# Patient Record
Sex: Male | Born: 1935 | Race: White | Hispanic: No | Smoking: Never smoker
Health system: Southern US, Community
[De-identification: ages and names within clinical notes are randomized; demographics above are authoritative.]

## PROBLEM LIST (undated history)

## (undated) DIAGNOSIS — I471 Supraventricular tachycardia, unspecified: Secondary | ICD-10-CM

## (undated) DIAGNOSIS — I480 Paroxysmal atrial fibrillation: Secondary | ICD-10-CM

## (undated) HISTORY — DX: Supraventricular tachycardia: I47.1

## (undated) HISTORY — PX: BACK SURGERY: SHX140

## (undated) HISTORY — PX: APPENDECTOMY: SHX54

## (undated) HISTORY — DX: Supraventricular tachycardia, unspecified: I47.10

## (undated) HISTORY — PX: ELBOW SURGERY: SHX618

## (undated) HISTORY — PX: HERNIA REPAIR: SHX51

## (undated) HISTORY — DX: Paroxysmal atrial fibrillation: I48.0

## (undated) HISTORY — PX: CHOLECYSTECTOMY: SHX55

---

## 2003-03-08 ENCOUNTER — Encounter: Payer: Self-pay | Admitting: Orthopedic Surgery

## 2003-03-08 ENCOUNTER — Ambulatory Visit (HOSPITAL_COMMUNITY): Admission: RE | Admit: 2003-03-08 | Discharge: 2003-03-08 | Payer: Self-pay | Admitting: Orthopedic Surgery

## 2003-05-16 ENCOUNTER — Encounter: Payer: Self-pay | Admitting: Orthopedic Surgery

## 2003-05-17 ENCOUNTER — Inpatient Hospital Stay (HOSPITAL_COMMUNITY): Admission: RE | Admit: 2003-05-17 | Discharge: 2003-05-20 | Payer: Self-pay | Admitting: Orthopedic Surgery

## 2019-10-16 ENCOUNTER — Other Ambulatory Visit: Payer: Self-pay

## 2019-10-16 ENCOUNTER — Emergency Department: Payer: Medicare PPO

## 2019-10-16 ENCOUNTER — Emergency Department
Admission: EM | Admit: 2019-10-16 | Discharge: 2019-10-16 | Disposition: A | Discharge status: Home / Self Care | Payer: Medicare PPO | Attending: Emergency Medicine | Admitting: Emergency Medicine

## 2019-10-16 DIAGNOSIS — R7989 Other specified abnormal findings of blood chemistry: Secondary | ICD-10-CM | POA: Insufficient documentation

## 2019-10-16 DIAGNOSIS — I4891 Unspecified atrial fibrillation: Secondary | ICD-10-CM | POA: Diagnosis not present

## 2019-10-16 DIAGNOSIS — R079 Chest pain, unspecified: Secondary | ICD-10-CM

## 2019-10-16 LAB — BASIC METABOLIC PANEL
Anion gap: 10 (ref 5–15)
BUN: 34 mg/dL — ABNORMAL HIGH (ref 8–23)
CO2: 22 mmol/L (ref 22–32)
Calcium: 8.7 mg/dL — ABNORMAL LOW (ref 8.9–10.3)
Chloride: 105 mmol/L (ref 98–111)
Creatinine, Ser: 1.69 mg/dL — ABNORMAL HIGH (ref 0.61–1.24)
GFR calc Af Amer: 41 mL/min — ABNORMAL LOW (ref >60)
GFR calc non Af Amer: 36 mL/min — ABNORMAL LOW (ref >60)
Glucose, Bld: 114 mg/dL — ABNORMAL HIGH (ref 70–99)
Potassium: 4.1 mmol/L (ref 3.5–5.1)
Sodium: 137 mmol/L (ref 135–145)

## 2019-10-16 LAB — TROPONIN I (HIGH SENSITIVITY)
Troponin I (High Sensitivity): 32 ng/L — ABNORMAL HIGH (ref <18)
Troponin I (High Sensitivity): 29 ng/L — ABNORMAL HIGH (ref <18)

## 2019-10-16 LAB — CBC
HCT: 43.6 % (ref 39.0–52.0)
Hemoglobin: 14.7 g/dL (ref 13.0–17.0)
MCH: 34.5 pg — ABNORMAL HIGH (ref 26.0–34.0)
MCHC: 33.7 g/dL (ref 30.0–36.0)
MCV: 102.3 fL — ABNORMAL HIGH (ref 80.0–100.0)
Platelets: 169 10*3/uL (ref 150–400)
RBC: 4.26 MIL/uL (ref 4.22–5.81)
RDW: 14.2 % (ref 11.5–15.5)
WBC: 7 10*3/uL (ref 4.0–10.5)
nRBC: 0 % (ref 0.0–0.2)

## 2019-10-16 MED ORDER — SODIUM CHLORIDE 0.9% FLUSH: 3.0000 mL | Freq: Once | INTRAVENOUS | Status: DC

## 2019-10-16 MED ORDER — APIXABAN 2.5 MG PO TABS: 2.5000 mg | ORAL_TABLET | Freq: Two times a day (BID) | ORAL | 1 refills | Status: DC

## 2019-10-16 MED ORDER — DILTIAZEM HCL ER COATED BEADS 120 MG PO CP24: 120.0000 mg | ORAL_CAPSULE | Freq: Every day | ORAL | 1 refills | Status: DC

## 2019-10-16 MED ORDER — DILTIAZEM HCL 60 MG PO TABS
30.0000 mg | ORAL_TABLET | Freq: Once | ORAL | Status: AC
  Administered 2019-10-16: 21:00:00 30 mg via ORAL
  Filled 2019-10-16: qty 1

## 2019-10-16 MED ORDER — DILTIAZEM HCL 25 MG/5ML IV SOLN
10.0000 mg | Freq: Once | INTRAVENOUS | Status: AC
  Administered 2019-10-16: 10 mg via INTRAVENOUS
  Filled 2019-10-16: qty 5

## 2019-10-16 NOTE — ED Triage Notes (Signed)
Pt c/o substernal chest pain for the past 2-3 days with intermittent chest heaviness/SOB. Denies N/V/diaphoresis, pt also c/o BL LE pain . No noted pedal edema., pulses present

## 2019-10-16 NOTE — Discharge Instructions (Addendum)
As we discussed your heart is in an abnormal rhythm called atrial fibrillation.  We have prescribed you medication to help keep the heart rate from beating too fast.  Additionally with you on a blood thinner.  Because of this please stop taking aspirin.  Additionally as we discussed we found that your kidney function was abnormal.  While you did decline admission to work on these problems, we do recommend that you focus on hydrating and establish care with a primary care doctor to recheck your kidney function.  We have also given you the number and name of a cardiologist to help manage your atrial fibrillation. Please return if you were to have any further chest pain or dizziness. Additionally with the blood thinner if you were to notice any bleeding or have any falls or trauma you would need to be seen.

## 2019-10-16 NOTE — ED Provider Notes (Signed)
[facility] Emergency Department Provider Note   ____________________________________________   I have reviewed the triage vital signs and the nursing notes.   HISTORY  Chief Complaint Chest Pain   History limited by: Not Limited   HPI Gary Lane is a 86 y.o. male who presents to the emergency department today with concerns for chest pain.  He states that he has had a sense of some heaviness in the center part of his chest.  Has been present for the past 2 or 3 days.  Is accompanied with some shortness of breath and dizziness.  He states he did have a fall because of it.  Patient says that he has had issues since receiving a flu vaccine over 1 year ago.  Since that time he has had intermittent episodes of dizziness and weakness in his legs.  The time my exam he denies any chest pain. Denies any fevers.   Records reviewed. Per medical record review patient has a history of cholecystectomy.  History reviewed. No pertinent past medical history.  There are no problems to display for this patient.   Past Surgical History:  Procedure Laterality Date  . ABDOMINAL SURGERY    . CHOLECYSTECTOMY      Prior to Admission medications   Not on File    Allergies Patient has no known allergies.  No family history on file.  Social History Social History   Tobacco Use  . Smoking status: Never Smoker  . Smokeless tobacco: Never Used  Substance Use Topics  . Alcohol use: Not Currently  . Drug use: Not Currently    Review of Systems Constitutional: No fever/chills Eyes: No visual changes. ENT: No sore throat. Cardiovascular: Positive for chest pain. Respiratory: Positive for shortness of breath. Gastrointestinal: No abdominal pain.  No nausea, no vomiting.  No diarrhea.   Genitourinary: Negative for dysuria. Musculoskeletal: Negative for back pain. Skin: Negative for rash. Neurological: Negative for headaches, focal weakness or  numbness.  ____________________________________________   PHYSICAL EXAM:  VITAL SIGNS: ED Triage Vitals  Enc Vitals Group     BP 10/16/19 1631 123/80     Pulse Rate 10/16/19 1631 70     Resp 10/16/19 1631 18     Temp 10/16/19 1631 97.6 F (36.4 C)     Temp Source 10/16/19 1631 Oral     SpO2 10/16/19 1631 98 %     Weight 10/16/19 1632 185 lb (83.9 kg)     Height 10/16/19 1632 6' (1.829 m)     Head Circumference --      Peak Flow --      Pain Score 10/16/19 1634 0   Constitutional: Alert and oriented.  Eyes: Conjunctivae are normal.  ENT      Head: Normocephalic and atraumatic.      Nose: No congestion/rhinnorhea.      Mouth/Throat: Mucous membranes are moist.      Neck: No stridor. Hematological/Lymphatic/Immunilogical: No cervical lymphadenopathy. Cardiovascular: Tachycardic, irregularly irregular rhythm.  No murmurs, rubs, or gallops.  Respiratory: Normal respiratory effort without tachypnea nor retractions. Breath sounds are clear and equal bilaterally. No wheezes/rales/rhonchi. Gastrointestinal: Soft and non tender. No rebound. No guarding.  Genitourinary: Deferred Musculoskeletal: Normal range of motion in all extremities. No lower extremity edema. Neurologic:  Normal speech and language. No gross focal neurologic deficits are appreciated.  Skin:  Skin is warm, dry and intact. No rash noted. Psychiatric: Mood and affect are normal. Speech and behavior are normal. Patient exhibits appropriate insight  and judgment.  ____________________________________________    LABS (pertinent positives/negatives)  Trop 32 -> 29 CBC wbc 7.0, hgb 14.7, plt 169 BMP na 137, k 4.1, glu 114, cr 1.69  ____________________________________________   EKG  I, Nance Pear, attending physician, personally viewed and interpreted this EKG  EKG Time: 1635 Rate: 129 Rhythm: atrial fibrillation with RVR Axis: left axis deviation Intervals: qtc 366 QRS: narrow, q waves V1, III ST  changes: no st elevation Impression: abnormal ekg  ____________________________________________    RADIOLOGY  CXR No focal consolidation  ____________________________________________   PROCEDURES  Procedures  ____________________________________________   INITIAL IMPRESSION / ASSESSMENT AND PLAN / ED COURSE  Pertinent labs & imaging results that were available during my care of the patient were reviewed by me and considered in my medical decision making (see chart for details).   Patient presented to the emergency department today because of concerns for some chest discomfort, dizziness and shortness of breath.  Patient's EKG did show A. fib with RVR.  Patient denies any history of atrial fibrillation but states he has not been to a doctor in many years.  His rate did improve after diltiazem.  Blood work showed slight elevation of opponent.  This did improve on repeat and I think this is likely secondary to increased rate rather than true ACS.  Patient's creatinine was also elevated here.  Because of these new findings I did recommend admission to the patient.  I discussed my concern for both the abnormal heart rhythm as well as elevated creatinine.  Patient however declined admission.  He stated he very much wanted to go home.  He did understand my concerns.  I discussed with Dr. Rayann Heman with cardiology.  Will plan on sending home on diltiazem as well as Eliquis.  Will plan on close cardiology follow-up.  I also discussed with patient portance of establishing care with primary care to reevaluate creatinine and kidney function.   ____________________________________________   FINAL CLINICAL IMPRESSION(S) / ED DIAGNOSES  Final diagnoses:  Atrial fibrillation, unspecified type ([place])  Elevated creatinine   Note: This dictation was prepared with Dragon dictation. Any transcriptional errors that result from this process are unintentional     Nance Pear, MD 10/16/19 2117

## 2019-10-17 ENCOUNTER — Telehealth (HOSPITAL_COMMUNITY): Payer: Self-pay | Admitting: Nurse Practitioner

## 2019-10-17 NOTE — Telephone Encounter (Signed)
Per staff message from Ventura Sellers, RN, called pt to schedule Virtual visit in f/u from [facility] ED visit.  Pt has appt with [facility] 10/29/19 so this visit would be bridge until he gets established with [place].

## 2019-10-18 NOTE — Telephone Encounter (Signed)
Patient's son, Trey Paula, returned the call and is aware of Virtual appt 10/19/19 with Rudi Coco, NP.  Son provided his contact number for this appt.

## 2019-10-18 NOTE — Telephone Encounter (Signed)
-----   Message from Shona Simpson, RN sent at 10/17/2019 11:37 AM EST ----- Can call and offer virtual visit for later this week as bridge to him establishing with [place] office per allred. ----- Message ----- From: Hillis Range, MD Sent: 10/16/2019   8:23 PM EST To: Shona Simpson, RN  Seen in [facility] ER with new onset afib and discharged with new eliquis and diltiazem.  (he refused to stay).   Please call in am to check on him and schedule close follow-up if he is willing.  He doesn't have PCP or cardiology care.

## 2019-10-18 NOTE — Telephone Encounter (Signed)
Called and spoke with patient about scheduling Virtual appt.  Pt stated he would prefer the appt be scheduled with his son.  I advised pt I would contact his son if he provided me with the son's contact info.  Pt stated he will call his son and have him contact our office to schedule the appt.

## 2019-10-19 ENCOUNTER — Ambulatory Visit (HOSPITAL_COMMUNITY)
Admission: RE | Admit: 2019-10-19 | Discharge: 2019-10-19 | Disposition: A | Discharge status: Home / Self Care | Payer: Medicare PPO | Source: Ambulatory Visit | Attending: Nurse Practitioner | Admitting: Nurse Practitioner

## 2019-10-19 ENCOUNTER — Other Ambulatory Visit: Payer: Self-pay

## 2019-10-19 ENCOUNTER — Ambulatory Visit (INDEPENDENT_AMBULATORY_CARE_PROVIDER_SITE_OTHER): Payer: Medicare PPO | Admitting: Cardiovascular Disease

## 2019-10-19 ENCOUNTER — Encounter: Payer: Self-pay | Admitting: Cardiovascular Disease

## 2019-10-19 ENCOUNTER — Encounter (HOSPITAL_COMMUNITY): Payer: Self-pay

## 2019-10-19 VITALS — BP 100/56 | HR 102 | Ht 72.0 in | Wt 185.2 lb

## 2019-10-19 DIAGNOSIS — R55 Syncope and collapse: Secondary | ICD-10-CM | POA: Diagnosis not present

## 2019-10-19 DIAGNOSIS — I4891 Unspecified atrial fibrillation: Secondary | ICD-10-CM

## 2019-10-19 NOTE — Patient Instructions (Signed)
Medication Instructions:  Your physician recommends that you continue on your current medications as directed. Please refer to the Current Medication list given to you today.  *If you need a refill on your cardiac medications before your next appointment, please call your pharmacy*  Lab Work: None ordered If you have labs (blood work) drawn today and your tests are completely normal, you will receive your results only by: Marland Kitchen MyChart Message (if you have MyChart) OR . A paper copy in the mail If you have any lab test that is abnormal or we need to change your treatment, we will call you to review the results.  Testing/Procedures: Your physician has requested that you have an echocardiogram. Echocardiography is a painless test that uses sound waves to create images of your heart. It provides your doctor with information about the size and shape of your heart and how well your heart's chambers and valves are working. This procedure takes approximately one hour. There are no restrictions for this procedure.   Your physician has recommended that you wear an zio monitor AT. Zio monitors are medical devices that record the heart's electrical activity. Doctors most often [place] these monitors to diagnose arrhythmias. Arrhythmias are problems with the speed or rhythm of the heartbeat. The monitor is a small, portable device. You can wear one while you do your normal daily activities. This is usually used to diagnose what is causing palpitations/syncope (passing out).  The monitor will be mailed to your home. Please follow the instruction on applying the monitor. It is to be worn for 14 days and then mailed back to the monitor company as instructed.    Follow-Up: At [facility], you and your health needs are our priority.  As part of our continuing mission to provide you with exceptional heart care, we have created designated Provider Care Teams.  These Care Teams include your primary Cardiologist  (physician) and Advanced Practice Providers (APPs -  Physician Assistants and Nurse Practitioners) who all work together to provide you with the care you need, when you need it.  Your next appointment:   After testing    The format for your next appointment:   In Person  Provider:    You may see  Dr. Kirke Corin  or one of the following Advanced Practice Providers on your designated Care Team:    Nicolasa Ducking, NP  Eula Listen, PA-C  Marisue Ivan, PA-C   Other Instructions N/A

## 2019-10-19 NOTE — Progress Notes (Signed)
Reached out to son by phone re f/u ER. He reports that he is taking cardizem/ DOAC and HR is in the 60's today. He does have an in person visit with Dr. Kirke Corin today so I will not do a visit not or  charge for this visit.

## 2019-10-19 NOTE — Progress Notes (Signed)
Cardiology Office Note   Date:  10/19/2019   ID:  Gary Lane, DOB Feb 03, 1930, MRN [id number]  PCP:  Gary Ivan, MD  Cardiologist:   Gary Bears, MD   Chief Complaint  Patient presents with  . New Patient (Initial Visit)    ED F/U-A Fib; Meds verbally reviewed with patient.      History of Present Illness: Gary Lane is a 86 y.o. male who was referred by Gary Lane at [facility] ED for evaluation of atrial fibrillation. The patient reports being healthy throughout his life and has not seen a primary care physician in many years.  He does not take any medications at home although he does admit to taking Goody powder and aspirin daily for many years.  He said he was afraid to come off these even when he was not having pain and kept taking them.  He is not aware of any chronic health issues.  Recently, he fell at home twice and did not remember the details.  Possibly he had syncopal episodes lasting for few minutes.  He felt extremely weak and was dizzy.  He went to [facility] primary care and was sent from there to the emergency room.  He did complain of some atypical chest pain. His EKG showed atrial fibrillation with RVR with a ventricular rate of 129 bpm.  His labs showed a creatinine of 1.69 with a BUN of 34.  Baseline renal function is not known.  CBC was unremarkable.  High-sensitivity troponin was mildly elevated at 32 with no significant change in delta in 2 hours.  Hospital admission was recommended but the patient declined.  Ventricular rate improved with diltiazem and the patient was discharged on diltiazem CD 120 mg once daily as well as anticoagulation with Eliquis 2.5 mg twice daily. He started taking these medications and feels a little bit better.  He continues to feel weak but no further syncopal episodes.  He denies chest pain.  He is a lifelong non-smoker.  Family history is negative for coronary artery disease.  His mother had a stroke.  Past Medical History:   Diagnosis Date  . Arrhythmia     Past Surgical History:  Procedure Laterality Date  . ABDOMINAL SURGERY    . CHOLECYSTECTOMY    . ELBOW SURGERY Right      Current Outpatient Medications  Medication Sig Dispense Refill  . apixaban (ELIQUIS) 2.5 MG TABS tablet Take 1 tablet (2.5 mg total) by mouth 2 (two) times daily. 60 tablet 1  . diltiazem (CARDIZEM CD) 120 MG 24 hr capsule Take 1 capsule (120 mg total) by mouth daily. 30 capsule 1   No current facility-administered medications for this visit.    Allergies:   Patient has no known allergies.    Social History:  The patient  reports that he has never smoked. He has never used smokeless tobacco. He reports previous alcohol use. He reports previous drug use.   Family History:  The patient's family history is negative for premature coronary artery disease.  His mother had a stroke.   ROS:  Please see the history of present illness.   Otherwise, review of systems are positive for none.   All other systems are reviewed and negative.    PHYSICAL EXAM: VS:  BP (!) 100/56 (BP Location: Left Arm, Patient Position: Sitting, Cuff Size: Normal)   Pulse (!) 102   Ht 6' (1.829 m)   Wt 185 lb 4 oz (84 kg)  BMI 25.12 kg/m  , BMI Body mass index is 25.12 kg/m. GEN: Well nourished, well developed, in no acute distress  HEENT: normal  Neck: no JVD, carotid bruits, or masses Cardiac: Irregularly irregular; no  rubs, or gallops,no edema .  1 out of 6 systolic murmur at the base. Respiratory:  clear to auscultation bilaterally, normal work of breathing GI: soft, nontender, nondistended, + BS MS: no deformity or atrophy  Skin: warm and dry, no rash Neuro:  Strength and sensation are intact Psych: euthymic mood, full affect   EKG:  EKG is ordered today. The ekg ordered today demonstrates atrial fibrillation with ventricular rate of 102 bpm.   Recent Labs: 10/16/2019: BUN 34; Creatinine, Ser 1.69; Hemoglobin 14.7; Platelets 169;  Potassium 4.1; Sodium 137    Lipid Panel No results found for: CHOL, TRIG, HDL, CHOLHDL, VLDL, LDLCALC, LDLDIRECT    Wt Readings from Last 3 Encounters:  10/19/19 185 lb 4 oz (84 kg)  10/16/19 185 lb (83.9 kg)       PAD Screen 10/19/2019  Previous PAD dx? No  Previous surgical procedure? No  Pain with walking? Yes  Subsides with rest? Yes  Feet/toe relief with dangling? No  Painful, non-healing ulcers? No  Extremities discolored? No      ASSESSMENT AND PLAN:  1.  Recent syncope: The exact etiology is not entirely clear.  Atrial fibrillation by itself usually does not cause syncope but it can in situations of significant rapid ventricular response with baseline hypotension or if there is postconversion pause.  I am going to obtain a 2-week outpatient monitor.  2.  Atrial fibrillation: Unknown duration but recently diagnosed.  Ventricular rate is better controlled with addition of small dose diltiazem.  Continue anticoagulation with Eliquis given chads vas score of 2.  I agree with the dose of 2.5 mg twice daily given creatinine above 1.5 and age above 80.  I requested an echocardiogram. Cardioversion can be considered upon follow-up.  3.  Abnormal kidney function: Possible chronic kidney disease given the we do not know his baseline.  He reports prolonged history of NSAIDs for many years.  I advised him to discontinue all NSAIDs products especially that he is now on anticoagulation with Eliquis.  He can use acetaminophen as needed.  The patient is going to see Dr. Netty Lane in early March.   Disposition:   FU with me in 1 month  Signed,  Gary Sacramento, MD  10/19/2019 3:38 PM    [place]

## 2019-10-29 ENCOUNTER — Ambulatory Visit: Payer: Medicare PPO | Admitting: Cardiology

## 2019-11-02 ENCOUNTER — Ambulatory Visit (INDEPENDENT_AMBULATORY_CARE_PROVIDER_SITE_OTHER): Payer: Medicare PPO

## 2019-11-02 DIAGNOSIS — I4891 Unspecified atrial fibrillation: Secondary | ICD-10-CM | POA: Diagnosis not present

## 2019-11-02 DIAGNOSIS — R55 Syncope and collapse: Secondary | ICD-10-CM | POA: Diagnosis not present

## 2019-11-07 ENCOUNTER — Ambulatory Visit (INDEPENDENT_AMBULATORY_CARE_PROVIDER_SITE_OTHER): Payer: Medicare PPO

## 2019-11-07 ENCOUNTER — Other Ambulatory Visit: Payer: Self-pay

## 2019-11-07 DIAGNOSIS — I4891 Unspecified atrial fibrillation: Secondary | ICD-10-CM | POA: Diagnosis not present

## 2019-11-07 DIAGNOSIS — R55 Syncope and collapse: Secondary | ICD-10-CM | POA: Diagnosis not present

## 2019-11-27 ENCOUNTER — Encounter: Payer: Self-pay | Admitting: Cardiovascular Disease

## 2019-11-27 ENCOUNTER — Ambulatory Visit (INDEPENDENT_AMBULATORY_CARE_PROVIDER_SITE_OTHER): Payer: Medicare PPO | Admitting: Cardiovascular Disease

## 2019-11-27 ENCOUNTER — Other Ambulatory Visit: Payer: Self-pay

## 2019-11-27 VITALS — BP 110/62 | HR 67 | Ht 72.0 in | Wt 185.2 lb

## 2019-11-27 DIAGNOSIS — R55 Syncope and collapse: Secondary | ICD-10-CM | POA: Diagnosis not present

## 2019-11-27 DIAGNOSIS — I480 Paroxysmal atrial fibrillation: Secondary | ICD-10-CM

## 2019-11-27 DIAGNOSIS — I4891 Unspecified atrial fibrillation: Secondary | ICD-10-CM | POA: Diagnosis not present

## 2019-11-27 MED ORDER — DILTIAZEM HCL ER COATED BEADS 120 MG PO CP24: 120.0000 mg | ORAL_CAPSULE | Freq: Every day | ORAL | 1 refills | Status: DC

## 2019-11-27 MED ORDER — APIXABAN 2.5 MG PO TABS: 2.5000 mg | ORAL_TABLET | Freq: Two times a day (BID) | ORAL | 1 refills | Status: DC

## 2019-11-27 NOTE — Patient Instructions (Signed)
Medication Instructions:  Your physician recommends that you continue on your current medications as directed. Please refer to the Current Medication list given to you today.  Your cardiac medications have been refilled.  *If you need a refill on your cardiac medications before your next appointment, please call your pharmacy*  Lab Work: Bmet and Cbc today.  If you have labs (blood work) drawn today and your tests are completely normal, you will receive your results only by: Marland Kitchen MyChart Message (if you have MyChart) OR . A paper copy in the mail If you have any lab test that is abnormal or we need to change your treatment, we will call you to review the results.  Testing/Procedures: None ordered  Follow-Up: At [facility], you and your health needs are our priority.  As part of our continuing mission to provide you with exceptional heart care, we have created designated Provider Care Teams.  These Care Teams include your primary Cardiologist (physician) and Advanced Practice Providers (APPs -  Physician Assistants and Nurse Practitioners) who all work together to provide you with the care you need, when you need it.  Your next appointment:   3 month(s)  The format for your next appointment:   In Person  Provider:    You may see  Dr. Kirke Corin or one of the following Advanced Practice Providers on your designated Care Team:    Nicolasa Ducking, NP  Eula Listen, PA-C  Marisue Ivan, PA-C   Other Instructions N/A

## 2019-11-27 NOTE — Progress Notes (Signed)
Cardiology Office Note   Date:  11/27/2019   ID:  Gary Lane, DOB [birth year], MRN [id number]  PCP:  Dion Body, MD  Cardiologist:   Kathlyn Sacramento, MD   Chief Complaint  Patient presents with  . office visit    F/U after echo and wearing ZIO. Patient reports difficulty sleeping; Meds verbally reviewed with patient and son.      History of Present Illness: Gary Lane is a 86 y.o. male who is here today for follow-up visit regarding atrial fibrillation.   He was seen in January after he fell twice with possible syncopal episodes.  He felt extremely dizzy.  He was found to have A. fib with RVR with a heart rate of 129 bpm.  His labs showed a creatinine of 1.69 with a BUN of 34.  Baseline renal function is not known.  CBC was unremarkable.  High-sensitivity troponin was mildly elevated at 32 with no significant change in delta in 2 hours.  Hospital admission was recommended but the patient declined.  Ventricular rate improved with diltiazem and the patient was discharged on diltiazem CD 120 mg once daily as well as anticoagulation with Eliquis 2.5 mg twice daily. He was diagnosed with UTI and was treated with antibiotics.  He has been taking his cardiac medications regularly and he reports improvement in symptoms. During last visit, I instructed him to discontinue all NSAIDs given that he was taking Goody powder on a regular basis for many years which might have affected his kidney function.  He reports almost resolution of dizziness with improvement in shortness of breath and no chest pain. Echocardiogram was done which showed normal LV systolic function with no significant valvular abnormalities and normal atrial size.  He had an outpatient monitor done that showed short runs of wide-complex tachycardia likely aberrancy with intermittent A. fib but he was mostly in sinus rhythm.   Past Medical History:  Diagnosis Date  . Arrhythmia     Past Surgical History:   Procedure Laterality Date  . APPENDECTOMY    . BACK SURGERY     lumbar  . CHOLECYSTECTOMY    . ELBOW SURGERY Right   . HERNIA REPAIR       Current Outpatient Medications  Medication Sig Dispense Refill  . acetaminophen (TYLENOL) 500 MG tablet Take 500 mg by mouth 2 (two) times daily as needed.    Marland Kitchen apixaban (ELIQUIS) 2.5 MG TABS tablet Take 1 tablet (2.5 mg total) by mouth 2 (two) times daily. 60 tablet 1  . cyanocobalamin 1000 MCG tablet Take 1,000 mcg by mouth daily.    Marland Kitchen diltiazem (CARDIZEM CD) 120 MG 24 hr capsule Take 1 capsule (120 mg total) by mouth daily. 30 capsule 1  . milk thistle 175 MG tablet Take 175 mg by mouth daily.    Marland Kitchen Specialty Vitamins Products (MAGNESIUM, AMINO ACID CHELATE,) 133 MG tablet Take 1 tablet by mouth 2 (two) times daily. 200mg  BID     No current facility-administered medications for this visit.    Allergies:   Patient has no known allergies.    Social History:  The patient  reports that he has never smoked. He has never used smokeless tobacco. He reports previous alcohol use. He reports previous drug use.   Family History:  The patient's family history is negative for premature coronary artery disease.  His mother had a stroke.   ROS:  Please see the history of present illness.   Otherwise, review of  systems are positive for none.   All other systems are reviewed and negative.    PHYSICAL EXAM: VS:  BP 110/62 (BP Location: Right Arm, Patient Position: Sitting, Cuff Size: Normal)   Pulse 67   Ht 6' (1.829 m)   Wt 185 lb 4 oz (84 kg)   SpO2 94%   BMI 25.12 kg/m  , BMI Body mass index is 25.12 kg/m. GEN: Well nourished, well developed, in no acute distress  HEENT: normal  Neck: no JVD, carotid bruits, or masses Cardiac: Regular rate and rhythm; no  rubs, or gallops,no edema .  1 out of 6 systolic murmur at the base. Respiratory:  clear to auscultation bilaterally, normal work of breathing GI: soft, nontender, nondistended, + BS MS: no  deformity or atrophy  Skin: warm and dry, no rash Neuro:  Strength and sensation are intact Psych: euthymic mood, full affect   EKG:  EKG is ordered today. The ekg ordered today demonstrates sinus rhythm with first-degree AV block.  Poor R wave progression in the anterior leads.   Recent Labs: 10/16/2019: BUN 34; Creatinine, Ser 1.69; Hemoglobin 14.7; Platelets 169; Potassium 4.1; Sodium 137    Lipid Panel No results found for: CHOL, TRIG, HDL, CHOLHDL, VLDL, LDLCALC, LDLDIRECT    Wt Readings from Last 3 Encounters:  11/27/19 185 lb 4 oz (84 kg)  10/19/19 185 lb 4 oz (84 kg)  10/16/19 185 lb (83.9 kg)       PAD Screen 10/19/2019  Previous PAD dx? No  Previous surgical procedure? No  Pain with walking? Yes  Subsides with rest? Yes  Feet/toe relief with dangling? No  Painful, non-healing ulcers? No  Extremities discolored? No      ASSESSMENT AND PLAN:  1.  Recent syncope: I suspect was likely due to A. fib with RVR with some volume depletion.  No further episodes.  Echocardiogram showed normal EF.  2.  Paroxysmal atrial fibrillation: It appears that he converted back to normal sinus rhythm and his heart rate today 67 bpm.  Even on recent outpatient monitor, he was noted to be mostly in sinus rhythm.  CHA2DS2-VASc score is 2 and thus I recommend continuing anticoagulation with Eliquis.  I also recommend diltiazem.  I requested a follow-up CBC and basic metabolic profile to see where his kidney function is.  If creatinine is below 1.5, we should increase Eliquis to 5 mg twice daily.  3.  Abnormal kidney function: Possible chronic kidney disease given the we do not know his baseline.  History of excessive NSAIDs which were discontinued.  Repeat labs.    Disposition:   FU with me in 3 months  Signed,  Lorine Bears, MD  11/27/2019 3:29 PM    [facility]

## 2019-11-28 LAB — BASIC METABOLIC PANEL
BUN/Creatinine Ratio: 16 (ref 10–24)
BUN: 18 mg/dL (ref 8–27)
CO2: 21 mmol/L (ref 20–29)
Calcium: 8.8 mg/dL (ref 8.6–10.2)
Chloride: 103 mmol/L (ref 96–106)
Creatinine, Ser: 1.13 mg/dL (ref 0.76–1.27)
GFR calc Af Amer: 67 mL/min/{1.73_m2} (ref >59)
GFR calc non Af Amer: 58 mL/min/{1.73_m2} — ABNORMAL LOW (ref >59)
Glucose: 95 mg/dL (ref 65–99)
Potassium: 4.3 mmol/L (ref 3.5–5.2)
Sodium: 137 mmol/L (ref 134–144)

## 2019-11-28 LAB — CBC WITH DIFFERENTIAL/PLATELET
Basophils Absolute: 0 10*3/uL (ref 0.0–0.2)
Basos: 0 %
EOS (ABSOLUTE): 0 10*3/uL (ref 0.0–0.4)
Eos: 1 %
Hematocrit: 36.8 % — ABNORMAL LOW (ref 37.5–51.0)
Hemoglobin: 12.9 g/dL — ABNORMAL LOW (ref 13.0–17.7)
Immature Grans (Abs): 0 10*3/uL (ref 0.0–0.1)
Immature Granulocytes: 1 %
Lymphocytes Absolute: 1.6 10*3/uL (ref 0.7–3.1)
Lymphs: 36 %
MCH: 35.1 pg — ABNORMAL HIGH (ref 26.6–33.0)
MCHC: 35.1 g/dL (ref 31.5–35.7)
MCV: 100 fL — ABNORMAL HIGH (ref 79–97)
Monocytes Absolute: 0.4 10*3/uL (ref 0.1–0.9)
Monocytes: 10 %
Neutrophils Absolute: 2.3 10*3/uL (ref 1.4–7.0)
Neutrophils: 52 %
Platelets: 162 10*3/uL (ref 150–450)
RBC: 3.68 x10E6/uL — ABNORMAL LOW (ref 4.14–5.80)
RDW: 14.1 % (ref 11.6–15.4)
WBC: 4.3 10*3/uL (ref 3.4–10.8)

## 2019-12-04 ENCOUNTER — Telehealth: Payer: Self-pay

## 2019-12-04 DIAGNOSIS — I480 Paroxysmal atrial fibrillation: Secondary | ICD-10-CM

## 2019-12-04 DIAGNOSIS — Z7901 Long term (current) use of anticoagulants: Secondary | ICD-10-CM

## 2019-12-04 MED ORDER — APIXABAN 5 MG PO TABS: 5.0000 mg | ORAL_TABLET | Freq: Two times a day (BID) | ORAL | 1 refills | Status: DC

## 2019-12-04 NOTE — Telephone Encounter (Signed)
DPR on file. Patients son Trey Paula made aware of lab results and Dr. Jari Sportsman recommendation. Rx sent to the patients pharmacy for Eliquis 5 mg bid. Order is in Epic for 1 month cbc [facility].

## 2019-12-04 NOTE — Telephone Encounter (Signed)
-----   Message from Iran Ouch, MD sent at 11/30/2019  2:06 PM EST ----- Inform patient that labs showed mild anemia with improvement in renal function.  Increase Eliquis to 5 mg twice daily.  Forward results to primary care physician and recheck CBC in 1 month.

## 2019-12-04 NOTE — Telephone Encounter (Signed)
-----   Message from Muhammad A Arida, MD sent at 11/30/2019  2:06 PM EST ----- Inform patient that labs showed mild anemia with improvement in renal function.  Increase Eliquis to 5 mg twice daily.  Forward results to primary care physician and recheck CBC in 1 month.  

## 2019-12-10 MED ORDER — DILTIAZEM HCL ER COATED BEADS 120 MG PO CP24: 120.0000 mg | ORAL_CAPSULE | Freq: Every day | ORAL | 1 refills | Status: DC

## 2019-12-19 NOTE — Telephone Encounter (Signed)
Replied to patient in other MyChart chat box.

## 2020-02-26 ENCOUNTER — Encounter: Payer: Self-pay | Admitting: Physician Assistant

## 2020-02-26 NOTE — Progress Notes (Signed)
Cardiology Office Note    Date:  02/29/2020   ID:  Gary Lane, DOB November 24, 1929, MRN [id number]  PCP:  Dion Body, MD  Cardiologist:  Kathlyn Sacramento, MD  Electrophysiologist:  None   Chief Complaint: Follow-up  History of Present Illness:   Gary Lane is a 86 y.o. male with history of PAF on Eliquis, paroxysmal SVT, AKI, and possible prior syncope who presents for follow-up of the above.  He was seen in the [facility] ED in 10/2019 after being sent there by his PCPs office for weakness, dizziness, and fall with possible syncopal episode.  His EKG showed A. fib with RVR with ventricular rates in the 120s bpm.  Labs showed some AKI with a BUN of 34 and serum creatinine of 1.69, with baseline being unknown.  CBC was unremarkable.  High-sensitivity troponin mildly elevated at 32 with no significant change in the delta.  Hospital admission was recommended though the patient declined.  Ventricular rates improved with diltiazem and he was discharged on Cardizem CD 120 mg once daily along with Eliquis 2.5 mg twice daily (age/SCr).  He was also treated for UTI.  He was seen by cardiology in follow-up on 10/19/2019 and reported feeling a little bit better though continued to feel little bit weak.  He denied any further syncopal episodes.  He was advised to discontinue all NSAIDs given he had been taking Goody powder on a regular basis for many years, as this was felt to possibly be affecting his renal function.  Zio patch from 10/2019 showed a predominant rhythm of normal sinus with an average heart rate of 64 bpm, 7 episodes of WCT with the longest lasting 7 beats (felt to be likely aberrancy), frequent runs of SVT with the longest lasting 23 seconds.  Some of these episodes were irregular and A. fib was not completely excluded.  Isolated PVCs with a burden of 1.7%.  Echo in 11/2019 showed an EF of 60 to 65%, mild LVH, grade 1 diastolic dysfunction, no regional wall motion abnormalities, normal RV  systolic function and ventricular cavity size, normal size left and right atria, mild tricuspid regurgitation, mild aortic valve sclerosis without stenosis.  He was last seen in the office in 11/2019 with his syncope felt to be likely secondary to A. fib with RVR with some degree of volume depletion.  At that visit he was in sinus rhythm.  With noted improvement in his renal function as outlined below his Eliquis was increased to 5 mg twice daily.    MyChart messages reviewed from 12/2019, following patient's Covid vaccine, with bilateral lower extremity weakness and paresthesias.  He comes in accompanied by his son and is doing reasonably well from a cardiac perspective.  No chest pain, dyspnea, palpitations, dizziness, presyncope, syncope.  No falls, hematochezia, melena, hematuria, hemoptysis, or hematemesis.  Tolerating Eliquis.  He does note continued fatigue though his son feels like this is slowly improving.  He is now living back at his own house and continues to perform ADLs independently.  His main concern at this time is insomnia.   Labs independently reviewed: 02/2020 - Hgb 12.5, PLT 150 11/2019 - BUN 18, serum creatinine 1.13, potassium 4.3, Hgb 12.9, PLT 162  Past Medical History:  Diagnosis Date  . PAF (paroxysmal atrial fibrillation) ([place])   . Paroxysmal SVT (supraventricular tachycardia) ([facility])     Past Surgical History:  Procedure Laterality Date  . APPENDECTOMY    . BACK SURGERY     lumbar  .  CHOLECYSTECTOMY    . ELBOW SURGERY Right   . HERNIA REPAIR      Current Medications: Current Meds  Medication Sig  . acetaminophen (TYLENOL) 500 MG tablet Take 500 mg by mouth 2 (two) times daily as needed.  Marland Kitchen apixaban (ELIQUIS) 5 MG TABS tablet Take 1 tablet (5 mg total) by mouth 2 (two) times daily.  . cyanocobalamin 1000 MCG tablet Take 1,000 mcg by mouth daily.  Marland Kitchen diltiazem (CARDIZEM CD) 120 MG 24 hr capsule Take 1 capsule (120 mg total) by mouth daily.  . milk thistle 175 MG  tablet Take 175 mg by mouth daily.  Marland Kitchen Specialty Vitamins Products (MAGNESIUM, AMINO ACID CHELATE,) 133 MG tablet Take 1 tablet by mouth 2 (two) times daily. 200mg  BID  . [DISCONTINUED] apixaban (ELIQUIS) 5 MG TABS tablet Take 1 tablet (5 mg total) by mouth 2 (two) times daily.    Allergies:   Patient has no known allergies.   Social History   Socioeconomic History  . Marital status: Divorced    Spouse name: Not on file  . Number of children: Not on file  . Years of education: Not on file  . Highest education level: Not on file  Occupational History  . Not on file  Tobacco Use  . Smoking status: Never Smoker  . Smokeless tobacco: Never Used  Substance and Sexual Activity  . Alcohol use: Not Currently  . Drug use: Not Currently  . Sexual activity: Not on file  Other Topics Concern  . Not on file  Social History Narrative  . Not on file   Social Determinants of Health   Financial Resource Strain:   . Difficulty of Paying Living Expenses:   Food Insecurity:   . Worried About in the Last Year:   . Programme researcher, broadcasting/film/video in the Last Year:   Transportation Needs:   . Barista (Medical):   Freight forwarder Lack of Transportation (Non-Medical):   Physical Activity:   . Days of Exercise per Week:   . Minutes of Exercise per Session:   Stress:   . Feeling of Stress :   Social Connections:   . Frequency of Communication with Friends and Family:   . Frequency of Social Gatherings with Friends and Family:   . Attends Religious Services:   . Active Member of Clubs or Organizations:   . Attends Marland Kitchen Meetings:   Banker Marital Status:      Family History:  The patient's family history includes Stroke in his mother.  ROS:   Review of Systems  Constitutional: Positive for malaise/fatigue. Negative for chills, diaphoresis, fever and weight loss.  HENT: Negative for congestion.   Eyes: Negative for discharge and redness.  Respiratory: Negative for cough,  hemoptysis, sputum production, shortness of breath and wheezing.   Cardiovascular: Negative for chest pain, palpitations, orthopnea, claudication, leg swelling and PND.  Gastrointestinal: Negative for abdominal pain, blood in stool, heartburn, melena, nausea and vomiting.  Genitourinary: Negative for hematuria.  Musculoskeletal: Negative for falls and myalgias.  Skin: Negative for rash.  Neurological: Positive for weakness. Negative for dizziness, tingling, tremors, sensory change, speech change, focal weakness and loss of consciousness.  Endo/Heme/Allergies: Does not bruise/bleed easily.  Psychiatric/Behavioral: Negative for substance abuse. The patient has insomnia. The patient is not nervous/anxious.   All other systems reviewed and are negative.    EKGs/Labs/Other Studies Reviewed:    Studies reviewed were summarized above. The additional studies were reviewed today:  2D echo 11/2019: 1. Left ventricular ejection fraction, by visual estimation, is 60 to  65%. The left ventricle has normal function. There is mildly increased  left ventricular hypertrophy.  2. Left ventricular diastolic parameters are consistent with Grade I  diastolic dysfunction (impaired relaxation).  3. The left ventricle has no regional wall motion abnormalities.  4. Global right ventricle has normal systolic function.The right  ventricular size is normal. Right vetricular wall thickness was not  assessed.  5. Left atrial size was normal.  6. Right atrial size was normal.  7. The mitral valve is normal in structure. No evidence of mitral valve  regurgitation.  8. The tricuspid valve is grossly normal.  9. The tricuspid valve is grossly normal. Tricuspid valve regurgitation  is mild.  10. The aortic valve was not well visualized. Aortic valve regurgitation  is trivial. Mild aortic valve sclerosis without stenosis.  11. The pulmonic valve was not well visualized. Pulmonic valve  regurgitation is  trivial. __________  Luci Bank patch 10/2019: Normal sinus rhythm with an average heart rate of 64 bpm. 7 episodes of wide-complex tachycardia noted.  The longest lasted 7 beats. Frequent runs of SVT the longest lasted 23 seconds.  Some of these episodes are irregular and thus A. fib cannot be completely excluded. Isolated PVCs with a burden of 1.7%.   EKG:  EKG is ordered today.  The EKG ordered today demonstrates NSR, 66 bpm, first-degree AV block, no acute ST-T changes  Recent Labs: 11/27/2019: BUN 18; Creatinine, Ser 1.13; Hemoglobin 12.9; Platelets 162; Potassium 4.3; Sodium 137  Recent Lipid Panel No results found for: CHOL, TRIG, HDL, CHOLHDL, VLDL, LDLCALC, LDLDIRECT  PHYSICAL EXAM:    VS:  BP 120/60 (BP Location: Left Arm, Patient Position: Sitting, Cuff Size: Normal)   Pulse 66   Ht 6' (1.829 m)   Wt 184 lb 2 oz (83.5 kg)   SpO2 98%   BMI 24.97 kg/m   BMI: Body mass index is 24.97 kg/m.  Physical Exam  Constitutional: He is oriented to person, place, and time. He appears well-developed and well-nourished.  HENT:  Head: Normocephalic and atraumatic.  Eyes: Right eye exhibits no discharge. Left eye exhibits no discharge.  Neck: No JVD present.  Cardiovascular: Normal rate, regular rhythm, S1 normal, S2 normal and normal heart sounds. Exam reveals no distant heart sounds, no friction rub, no midsystolic click and no opening snap.  No murmur heard. Pulses:      Posterior tibial pulses are 2+ on the right side and 2+ on the left side.  Pulmonary/Chest: Effort normal and breath sounds normal. No respiratory distress. He has no decreased breath sounds. He has no wheezes. He has no rales. He exhibits no tenderness.  Abdominal: Soft. He exhibits no distension. There is no abdominal tenderness.  Musculoskeletal:        General: No edema.     Cervical back: Normal range of motion.  Neurological: He is alert and oriented to person, place, and time.  Skin: Skin is warm and dry. No  cyanosis. Nails show no clubbing.  Psychiatric: He has a normal mood and affect. His speech is normal and behavior is normal. Judgment and thought content normal.    Wt Readings from Last 3 Encounters:  02/29/20 184 lb 2 oz (83.5 kg)  11/27/19 185 lb 4 oz (84 kg)  10/19/19 185 lb 4 oz (84 kg)     ASSESSMENT & PLAN:   1. PAF: Maintaining sinus rhythm with well-controlled heart rate.  Given reports of insomnia and with noted first-degree AV block on EKG, which is relatively stable when compared to last study, we have elected to discontinue Cardizem CD.  Deferred initiation of beta-blocker given first-degree AV block.  Given his CHA2DS2-VASc of at least 75 (age x2, vascular disease) he will be continued on Eliquis.  Recent CBC obtained earlier today showed stable hemoglobin.  Recent renal function showed improved SCr.  2. Fall versus syncope: No further issues.  This has been thought to be in the setting of A. fib with RVR and dehydration.  Recent outpatient cardiac monitoring showed no malignant arrhythmias, high-grade AV block, or prolonged pauses.  3. First-degree AV block: Asymptomatic.  Discontinue diltiazem as outlined above.  4. AKI: Resolved with discontinuation of Goody powder and gentle hydration.  Continue to avoid excessive NSAID use.  Disposition: F/u with Dr. Kirke Corin or an APP in 3 months.   Medication Adjustments/Labs and Tests Ordered: Current medicines are reviewed at length with the patient today.  Concerns regarding medicines are outlined above. Medication changes, Labs and Tests ordered today are summarized above and listed in the Patient Instructions accessible in Encounters.   Signed, Eula Listen, PA-C 02/29/2020 12:50 PM     [facility] [address] [place], [place] [address] [phone]

## 2020-02-29 ENCOUNTER — Encounter: Payer: Self-pay | Admitting: Physician Assistant

## 2020-02-29 ENCOUNTER — Ambulatory Visit (INDEPENDENT_AMBULATORY_CARE_PROVIDER_SITE_OTHER): Payer: Medicare PPO | Admitting: Physician Assistant

## 2020-02-29 ENCOUNTER — Other Ambulatory Visit: Payer: Self-pay

## 2020-02-29 VITALS — BP 120/60 | HR 66 | Ht 72.0 in | Wt 184.1 lb

## 2020-02-29 DIAGNOSIS — R55 Syncope and collapse: Secondary | ICD-10-CM | POA: Diagnosis not present

## 2020-02-29 DIAGNOSIS — I480 Paroxysmal atrial fibrillation: Secondary | ICD-10-CM

## 2020-02-29 DIAGNOSIS — I440 Atrioventricular block, first degree: Secondary | ICD-10-CM

## 2020-02-29 DIAGNOSIS — N179 Acute kidney failure, unspecified: Secondary | ICD-10-CM | POA: Diagnosis not present

## 2020-02-29 MED ORDER — APIXABAN 5 MG PO TABS: 5.0000 mg | ORAL_TABLET | Freq: Two times a day (BID) | ORAL | 3 refills | Status: DC

## 2020-02-29 NOTE — Patient Instructions (Signed)
Medication Instructions:  1- STOP Diltiazem *If you need a refill on your cardiac medications before your next appointment, please call your pharmacy*   Lab Work: None ordered  If you have labs (blood work) drawn today and your tests are completely normal, you will receive your results only by: Marland Kitchen MyChart Message (if you have MyChart) OR . A paper copy in the mail If you have any lab test that is abnormal or we need to change your treatment, we will call you to review the results.   Testing/Procedures: None ordered    Follow-Up: At [facility], you and your health needs are our priority.  As part of our continuing mission to provide you with exceptional heart care, we have created designated Provider Care Teams.  These Care Teams include your primary Cardiologist (physician) and Advanced Practice Providers (APPs -  Physician Assistants and Nurse Practitioners) who all work together to provide you with the care you need, when you need it.  We recommend signing up for the patient portal called "MyChart".  Sign up information is provided on this After Visit Summary.  MyChart is used to connect with patients for Virtual Visits (Telemedicine).  Patients are able to view lab/test results, encounter notes, upcoming appointments, etc.  Non-urgent messages can be sent to your provider as well.   To learn more about what you can do with MyChart, go to [email or web address].    Your next appointment:   3 month(s)  The format for your next appointment:   In Person  Provider:    You may see Lorine Bears, MD or Eula Listen, PA-C

## 2020-06-05 ENCOUNTER — Encounter: Payer: Self-pay | Admitting: Cardiovascular Disease

## 2020-06-05 ENCOUNTER — Ambulatory Visit (INDEPENDENT_AMBULATORY_CARE_PROVIDER_SITE_OTHER): Payer: Medicare PPO | Admitting: Cardiovascular Disease

## 2020-06-05 ENCOUNTER — Other Ambulatory Visit: Payer: Self-pay

## 2020-06-05 VITALS — BP 108/60 | HR 72 | Ht 72.0 in | Wt 177.0 lb

## 2020-06-05 DIAGNOSIS — I480 Paroxysmal atrial fibrillation: Secondary | ICD-10-CM

## 2020-06-05 DIAGNOSIS — R55 Syncope and collapse: Secondary | ICD-10-CM | POA: Diagnosis not present

## 2020-06-05 NOTE — Progress Notes (Signed)
Cardiology Office Note   Date:  06/05/2020   ID:  EGOR FULLILOVE, DOB 21-Jan-1930, MRN [id number]  PCP:  Marisue Ivan, MD  Cardiologist:   Lorine Bears, MD   Chief Complaint  Patient presents with   Other    3 month follow up. patient c.o SOB when walking. meds reviewed verbally with patient.       History of Present Illness: Gary Lane is a 86 y.o. male who is here today for follow-up visit regarding paroxysmal atrial fibrillation and syncope. He had syncopal episodes in January when he was found to be in A. fib with RVR with mildly elevated creatinine. Ventricular rate improved with diltiazem. He was diagnosed with UTI and was treated with antibiotics.   He was taking NSAIDs and Goody powder on a regular basis and these were discontinued.  Echocardiogram in February showed normal LV systolic function with no significant valvular abnormalities and normal atrial size.  He had an outpatient monitor done that showed short runs of wide-complex tachycardia likely aberrancy with intermittent A. fib but he was mostly in sinus rhythm. His renal function subsequently improved and Eliquis was increased to 5 mg twice daily.  His blood pressure is somewhat on the low side and thus is no longer on diltiazem.  He is feeling well with no chest pain, dyspnea or palpitations.   Past Medical History:  Diagnosis Date   PAF (paroxysmal atrial fibrillation) ([facility])    Paroxysmal SVT (supraventricular tachycardia) ([facility])     Past Surgical History:  Procedure Laterality Date   APPENDECTOMY     BACK SURGERY     lumbar   CHOLECYSTECTOMY     ELBOW SURGERY Right    HERNIA REPAIR       Current Outpatient Medications  Medication Sig Dispense Refill   acetaminophen (TYLENOL) 500 MG tablet Take 500 mg by mouth 2 (two) times daily as needed.     apixaban (ELIQUIS) 5 MG TABS tablet Take 5 mg by mouth 2 (two) times daily.     cyanocobalamin 1000 MCG tablet Take 1,000 mcg by  mouth daily.     milk thistle 175 MG tablet Take 175 mg by mouth daily.     Specialty Vitamins Products (MAGNESIUM, AMINO ACID CHELATE,) 133 MG tablet Take 1 tablet by mouth 2 (two) times daily. 200mg  BID     No current facility-administered medications for this visit.    Allergies:   Patient has no known allergies.    Social History:  The patient  reports that he has never smoked. He has never used smokeless tobacco. He reports previous alcohol use. He reports previous drug use.   Family History:  The patient's family history is negative for premature coronary artery disease.  His mother had a stroke.   ROS:  Please see the history of present illness.   Otherwise, review of systems are positive for none.   All other systems are reviewed and negative.    PHYSICAL EXAM: VS:  BP 108/60 (BP Location: Right Arm, Patient Position: Sitting, Cuff Size: Normal)    Pulse 72    Ht 6' (1.829 m)    Wt 177 lb (80.3 kg)    BMI 24.01 kg/m  , BMI Body mass index is 24.01 kg/m. GEN: Well nourished, well developed, in no acute distress  HEENT: normal  Neck: no JVD, carotid bruits, or masses Cardiac: Regular rate and rhythm; no  rubs, or gallops,no edema .  1 out of 6 systolic  murmur at the base. Respiratory:  clear to auscultation bilaterally, normal work of breathing GI: soft, nontender, nondistended, + BS MS: no deformity or atrophy  Skin: warm and dry, no rash Neuro:  Strength and sensation are intact Psych: euthymic mood, full affect   EKG:  EKG is ordered today. The ekg ordered today demonstrates normal sinus rhythm with a heart rate of 73 bpm.  No significant ST or T wave changes.   Recent Labs: 11/27/2019: BUN 18; Creatinine, Ser 1.13; Hemoglobin 12.9; Platelets 162; Potassium 4.3; Sodium 137    Lipid Panel No results found for: CHOL, TRIG, HDL, CHOLHDL, VLDL, LDLCALC, LDLDIRECT    Wt Readings from Last 3 Encounters:  06/05/20 177 lb (80.3 kg)  02/29/20 184 lb 2 oz (83.5 kg)   11/27/19 185 lb 4 oz (84 kg)       PAD Screen 10/19/2019  Previous PAD dx? No  Previous surgical procedure? No  Pain with walking? Yes  Subsides with rest? Yes  Feet/toe relief with dangling? No  Painful, non-healing ulcers? No  Extremities discolored? No      ASSESSMENT AND PLAN:  1.  Syncope: Thought to be due to A. fib with RVR in the setting of volume depletion.  No further episodes.  2.  Paroxysmal atrial fibrillation: He is maintaining in sinus rhythm without any medication.  Continue long-term anticoagulation with Eliquis given CHA2DS2-VASc score of 2.  3.  Abnormal kidney function: Improved after stopping NSAIDs.  Most recent creatinine was 1.2.   Disposition:   FU with me in 6 months  Signed,  Lorine Bears, MD  06/05/2020 4:10 PM    [facility]

## 2020-06-05 NOTE — Patient Instructions (Signed)
Medication Instructions:   Your physician recommends that you continue on your current medications as directed. Please refer to the Current Medication list given to you today.  *If you need a refill on your cardiac medications before your next appointment, please call your pharmacy*   Lab Work:  None Ordered   If you have labs (blood work) drawn today and your tests are completely normal, you will receive your results only by: Marland Kitchen MyChart Message (if you have MyChart) OR . A paper copy in the mail If you have any lab test that is abnormal or we need to change your treatment, we will call you to review the results.   Testing/Procedures:  None Ordered  Follow-Up: At [facility], you and your health needs are our priority.  As part of our continuing mission to provide you with exceptional heart care, we have created designated Provider Care Teams.  These Care Teams include your primary Cardiologist (physician) and Advanced Practice Providers (APPs -  Physician Assistants and Nurse Practitioners) who all work together to provide you with the care you need, when you need it.  We recommend signing up for the patient portal called "MyChart".  Sign up information is provided on this After Visit Summary.  MyChart is used to connect with patients for Virtual Visits (Telemedicine).  Patients are able to view lab/test results, encounter notes, upcoming appointments, etc.  Non-urgent messages can be sent to your provider as well.   To learn more about what you can do with MyChart, go to [email or web address].    Your next appointment:   6 month(s)  The format for your next appointment:   In Person  Provider:    You may see Lorine Bears, MD or one of the following Advanced Practice Providers on your designated Care Team:    Nicolasa Ducking, NP  Eula Listen, PA-C  Marisue Ivan, PA-C    Other Instructions

## 2021-01-08 NOTE — Progress Notes (Signed)
Cardiology Office Note    Date:  01/09/2021   ID:  Gary Lane, DOB 18-May-1930, MRN [id number]  PCP:  Marisue Ivan, MD  Cardiologist:  Lorine Bears, MD  Electrophysiologist:  None   Chief Complaint: Follow up   History of Present Illness:   Gary Lane is a 87 y.o. male with history of PAF on Eliquis, paroxysmal SVT, AKI, and possible prior syncope who presents for follow-up of A. fib.  He was seen in the [facility] ED in 10/2019 after being sent there by his PCPs office for weakness, dizziness, and fall with possible syncopal episode.  His EKG showed A. fib with RVR with ventricular rates in the 120s bpm.  Labs showed some AKI with a BUN of 34 and serum creatinine of 1.69, with baseline being unknown.  CBC was unremarkable.  High-sensitivity troponin mildly elevated at 32 with no significant change in the delta.  Hospital admission was recommended though the patient declined.  Ventricular rates improved with diltiazem and he was discharged on Cardizem CD 120 mg along with Eliquis 2.5 mg twice daily (age/SCr).  He was also treated for UTI.  He was seen by cardiology in follow-up on 10/19/2019 and reported feeling a little bit better though continued to feel little bit weak.  He denied any further syncopal episodes.  He was advised to discontinue all NSAIDs given he had been taking Goody powder on a regular basis for many years, as this was felt to possibly be affecting his renal function.  Zio patch from 10/2019 showed a predominant rhythm of normal sinus with an average heart rate of 64 bpm, 7 episodes of WCT with the longest lasting 7 beats (felt to be likely aberrancy), frequent runs of SVT with the longest lasting 23 seconds.  Some of these episodes were irregular and A. fib was not completely excluded.  Isolated PVCs with a burden of 1.7%.  Echo in 11/2019 showed an EF of 60 to 65%, mild LVH, grade 1 diastolic dysfunction, no regional wall motion abnormalities, normal RV systolic  function and ventricular cavity size, normal size left and right atria, mild tricuspid regurgitation, mild aortic valve sclerosis without stenosis.  He was seen in the office in 11/2019 with his syncope felt to be likely secondary to A. fib with RVR with some degree of volume depletion.  At that visit he was in sinus rhythm.  With noted improvement in his renal function as outlined below his Eliquis was increased to 5 mg twice daily.  Soft BP has led to the holding of diltiazem in the past.  He was last seen in the office in 06/2020, and was doing well from a cardiac perspective.   He comes in accompanied by his son today and is doing well from a cardiac perspective.  No chest pain, palpitations, dyspnea, dizziness, presyncope, syncope, falls, hematochezia, or melena.  He does note generalized fatigue and easy bruising.  Otherwise, he is without complaints.  His weight is up 10 pounds when compared to his visit in 06/2020.  No lower extremity swelling, abdominal distention, orthopnea, PND, or early satiety.   Labs independently reviewed: 09/2020 - HGB 11.9, PLT 135, potassium 4.6, BUN 18, SCr 1.1 02/2020 - albumin 3.9, AST/ALT normal, TC 168, TG 92, HDL 35, LDL 114  Past Medical History:  Diagnosis Date  . PAF (paroxysmal atrial fibrillation) (HCC)   . Paroxysmal SVT (supraventricular tachycardia) (HCC)     Past Surgical History:  Procedure Laterality Date  .  APPENDECTOMY    . BACK SURGERY     lumbar  . CHOLECYSTECTOMY    . ELBOW SURGERY Right   . HERNIA REPAIR      Current Medications: Current Meds  Medication Sig  . acetaminophen (TYLENOL) 500 MG tablet Take 500 mg by mouth 2 (two) times daily as needed.  Marland Kitchen. apixaban (ELIQUIS) 5 MG TABS tablet Take 5 mg by mouth 2 (two) times daily.  . cyanocobalamin 1000 MCG tablet Take 1,000 mcg by mouth daily.  Marland Kitchen. GARLIC PO Take by mouth daily at 8 pm.  . Glucosamine HCl (GLUCOSAMINE PO) Take by mouth daily at 2 am.  . milk thistle 175 MG tablet Take  175 mg by mouth daily.  Marland Kitchen. VITAMIN D PO Take by mouth daily at 8 pm.    Allergies:   Patient has no known allergies.   Social History   Socioeconomic History  . Marital status: Divorced    Spouse name: Not on file  . Number of children: Not on file  . Years of education: Not on file  . Highest education level: Not on file  Occupational History  . Not on file  Tobacco Use  . Smoking status: Never Smoker  . Smokeless tobacco: Never Used  Vaping Use  . Vaping Use: Never used  Substance and Sexual Activity  . Alcohol use: Not Currently  . Drug use: Not Currently  . Sexual activity: Not on file  Other Topics Concern  . Not on file  Social History Narrative  . Not on file   Social Determinants of Health   Financial Resource Strain: Not on file  Food Insecurity: Not on file  Transportation Needs: Not on file  Physical Activity: Not on file  Stress: Not on file  Social Connections: Not on file     Family History:  The patient's family history includes Stroke in his mother.  ROS:   Review of Systems  Constitutional: Positive for malaise/fatigue. Negative for chills, diaphoresis, fever and weight loss.  HENT: Negative for congestion.   Eyes: Negative for discharge and redness.  Respiratory: Negative for cough, sputum production, shortness of breath and wheezing.   Cardiovascular: Negative for chest pain, palpitations, orthopnea, claudication, leg swelling and PND.  Gastrointestinal: Negative for abdominal pain, blood in stool, heartburn, melena, nausea and vomiting.  Musculoskeletal: Negative for falls and myalgias.  Skin: Negative for rash.  Neurological: Positive for weakness. Negative for dizziness, tingling, tremors, sensory change, speech change, focal weakness and loss of consciousness.  Endo/Heme/Allergies: Bruises/bleeds easily.  Psychiatric/Behavioral: Negative for substance abuse. The patient is not nervous/anxious.   All other systems reviewed and are  negative.    EKGs/Labs/Other Studies Reviewed:    Studies reviewed were summarized above. The additional studies were reviewed today:  2D echo 11/2019: 1. Left ventricular ejection fraction, by visual estimation, is 60 to  65%. The left ventricle has normal function. There is mildly increased  left ventricular hypertrophy.  2. Left ventricular diastolic parameters are consistent with Grade I  diastolic dysfunction (impaired relaxation).  3. The left ventricle has no regional wall motion abnormalities.  4. Global right ventricle has normal systolic function.The right  ventricular size is normal. Right vetricular wall thickness was not  assessed.  5. Left atrial size was normal.  6. Right atrial size was normal.  7. The mitral valve is normal in structure. No evidence of mitral valve  regurgitation.  8. The tricuspid valve is grossly normal.  9. The tricuspid valve is  grossly normal. Tricuspid valve regurgitation  is mild.  10. The aortic valve was not well visualized. Aortic valve regurgitation  is trivial. Mild aortic valve sclerosis without stenosis.  11. The pulmonic valve was not well visualized. Pulmonic valve  regurgitation is trivial. __________  Luci Bank patch 10/2019: Normal sinus rhythm with an average heart rate of 64 bpm. 7 episodes of wide-complex tachycardia noted. The longest lasted 7 beats. Frequent runs of SVT the longest lasted 23 seconds. Some of these episodes are irregular and thus A. fib cannot be completely excluded. Isolated PVCs with a burden of 1.7%.   EKG:  EKG is ordered today.  The EKG ordered today demonstrates NSR, 70 bpm, left axis deviation, first-degree AV block, rare PVC, no acute ST-T change  Recent Labs: 01/09/2021: BUN 17; Creatinine, Ser 1.07; Hemoglobin 11.1; Platelets 128; Potassium 4.1; Sodium 139  Recent Lipid Panel No results found for: CHOL, TRIG, HDL, CHOLHDL, VLDL, LDLCALC, LDLDIRECT  PHYSICAL EXAM:    VS:  BP 140/80 (BP  Location: Left Arm, Patient Position: Sitting, Cuff Size: Normal)   Pulse 70   Ht 6' (1.829 m)   Wt 187 lb 8 oz (85 kg)   SpO2 98%   BMI 25.43 kg/m   BMI: Body mass index is 25.43 kg/m.  Physical Exam Vitals reviewed.  Constitutional:      Appearance: He is well-developed.  HENT:     Head: Normocephalic and atraumatic.  Eyes:     General:        Right eye: No discharge.        Left eye: No discharge.  Neck:     Vascular: No JVD.  Cardiovascular:     Rate and Rhythm: Normal rate and regular rhythm.     Pulses: No midsystolic click and no opening snap.          Posterior tibial pulses are 2+ on the right side and 2+ on the left side.     Heart sounds: S1 normal and S2 normal. Heart sounds not distant. Murmur heard.   Systolic murmur is present with a grade of 2/6 at the upper right sternal border. No friction rub.  Pulmonary:     Effort: Pulmonary effort is normal. No respiratory distress.     Breath sounds: Normal breath sounds. No decreased breath sounds, wheezing or rales.  Chest:     Chest wall: No tenderness.  Abdominal:     General: There is no distension.     Palpations: Abdomen is soft.     Tenderness: There is no abdominal tenderness.  Musculoskeletal:     Cervical back: Normal range of motion.  Skin:    General: Skin is warm and dry.     Nails: There is no clubbing.  Neurological:     Mental Status: He is alert and oriented to person, place, and time.  Psychiatric:        Speech: Speech normal.        Behavior: Behavior normal.        Thought Content: Thought content normal.        Judgment: Judgment normal.     Wt Readings from Last 3 Encounters:  01/09/21 187 lb 8 oz (85 kg)  06/05/20 177 lb (80.3 kg)  02/29/20 184 lb 2 oz (83.5 kg)     ASSESSMENT & PLAN:   1. PAF: Maintaining sinus rhythm without rate controlling medications.  Remains on Eliquis 5 mg twice daily given CHADS2VASc at least 3 (age x 2,  vascular disease) without symptoms concerning  for bleeding.  Based on available data at this time he does not meet reduced dosing criteria.  Check BMP and CBC.  2. Syncope: No further episodes.  Felt to be related to A. fib with RVR in the context of volume depletion.  3. First degree AV block: Asymptomatic.  Not currently on any AV nodal blocking medications.  4. AKI: Improved following discontinuation of NSAIDs.  Check BMP.  5. Murmur: Check echo.  Disposition: F/u with Dr. Kirke Corin or an APP in 6 months, sooner if needed.   Medication Adjustments/Labs and Tests Ordered: Current medicines are reviewed at length with the patient today.  Concerns regarding medicines are outlined above. Medication changes, Labs and Tests ordered today are summarized above and listed in the Patient Instructions accessible in Encounters.   Signed, Eula Listen, PA-C 01/09/2021 12:28 PM     [facility] [place] [address] [place], [place] [address] [phone]

## 2021-01-09 ENCOUNTER — Other Ambulatory Visit
Admission: RE | Admit: 2021-01-09 | Discharge: 2021-01-09 | Disposition: A | Discharge status: Home / Self Care | Payer: Medicare PPO | Source: Ambulatory Visit | Attending: Physician Assistant | Admitting: Physician Assistant

## 2021-01-09 ENCOUNTER — Ambulatory Visit: Payer: Medicare PPO | Admitting: Physician Assistant

## 2021-01-09 ENCOUNTER — Telehealth: Payer: Self-pay | Admitting: *Deleted

## 2021-01-09 ENCOUNTER — Encounter: Payer: Self-pay | Admitting: Physician Assistant

## 2021-01-09 ENCOUNTER — Other Ambulatory Visit: Payer: Self-pay

## 2021-01-09 VITALS — BP 140/80 | HR 70 | Ht 72.0 in | Wt 187.5 lb

## 2021-01-09 DIAGNOSIS — I480 Paroxysmal atrial fibrillation: Secondary | ICD-10-CM | POA: Insufficient documentation

## 2021-01-09 DIAGNOSIS — I440 Atrioventricular block, first degree: Secondary | ICD-10-CM

## 2021-01-09 DIAGNOSIS — Z7901 Long term (current) use of anticoagulants: Secondary | ICD-10-CM

## 2021-01-09 DIAGNOSIS — N179 Acute kidney failure, unspecified: Secondary | ICD-10-CM | POA: Diagnosis not present

## 2021-01-09 DIAGNOSIS — R55 Syncope and collapse: Secondary | ICD-10-CM | POA: Diagnosis not present

## 2021-01-09 DIAGNOSIS — R011 Cardiac murmur, unspecified: Secondary | ICD-10-CM

## 2021-01-09 LAB — BASIC METABOLIC PANEL
Anion gap: 7 (ref 5–15)
BUN: 17 mg/dL (ref 8–23)
CO2: 25 mmol/L (ref 22–32)
Calcium: 8.8 mg/dL — ABNORMAL LOW (ref 8.9–10.3)
Chloride: 107 mmol/L (ref 98–111)
Creatinine, Ser: 1.07 mg/dL (ref 0.61–1.24)
GFR, Estimated: >60 mL/min (ref >60)
Glucose, Bld: 98 mg/dL (ref 70–99)
Potassium: 4.1 mmol/L (ref 3.5–5.1)
Sodium: 139 mmol/L (ref 135–145)

## 2021-01-09 LAB — CBC
HCT: 34.4 % — ABNORMAL LOW (ref 39.0–52.0)
Hemoglobin: 11.1 g/dL — ABNORMAL LOW (ref 13.0–17.0)
MCH: 34.6 pg — ABNORMAL HIGH (ref 26.0–34.0)
MCHC: 32.3 g/dL (ref 30.0–36.0)
MCV: 107.2 fL — ABNORMAL HIGH (ref 80.0–100.0)
Platelets: 128 10*3/uL — ABNORMAL LOW (ref 150–400)
RBC: 3.21 MIL/uL — ABNORMAL LOW (ref 4.22–5.81)
RDW: 14.1 % (ref 11.5–15.5)
WBC: 4.8 10*3/uL (ref 4.0–10.5)
nRBC: 0 % (ref 0.0–0.2)

## 2021-01-09 NOTE — Patient Instructions (Signed)
Medication Instructions:  No changes  *If you need a refill on your cardiac medications before your next appointment, please call your pharmacy*   Lab Work: BMET & CBC today. Go to [facility] and check in at registration. They will then tell you where to go.   If you have labs (blood work) drawn today and your tests are completely normal, you will receive your results only by: Marland Kitchen MyChart Message (if you have MyChart) OR . A paper copy in the mail If you have any lab test that is abnormal or we need to change your treatment, we will call you to review the results.   Testing/Procedures: Your physician has requested that you have an echocardiogram. Echocardiography is a painless test that uses sound waves to create images of your heart. It provides your doctor with information about the size and shape of your heart and how well your heart's chambers and valves are working. This procedure takes approximately one hour. There are no restrictions for this procedure.     Follow-Up: At [facility], you and your health needs are our priority.  As part of our continuing mission to provide you with exceptional heart care, we have created designated Provider Care Teams.  These Care Teams include your primary Cardiologist (physician) and Advanced Practice Providers (APPs -  Physician Assistants and Nurse Practitioners) who all work together to provide you with the care you need, when you need it.   Your next appointment:   6 month(s)  The format for your next appointment:   In Person  Provider:   Lorine Bears, MD or Eula Listen, PA-C

## 2021-01-09 NOTE — Telephone Encounter (Signed)
-----   Message from Sondra Barges, PA-C sent at 01/09/2021  1:11 PM EDT ----- Potassium at goal Renal function normal Blood count and platelet count mildly low Over the past year and 3 months his blood count has dropped from 14.7 to a current value of 11.1  Recommendations: -For now he should continue Eliquis -He should schedule an appointment with his PCP to discuss ongoing anemia and now thrombocytopenia if he has not already done so -Please have him come to the medical mall for a follow-up CBC in 1 week to ensure stable hemoglobin and platelet count -His downtrending hemoglobin is likely contributing to his fatigue

## 2021-01-09 NOTE — Telephone Encounter (Signed)
Spoke with patients son because his father has hard time hearing. Reviewed results and recommendations and he reports patient has upcoming appointment with his PCP. He verbalized understanding of our conversation and had no further questions at this time.

## 2021-01-16 ENCOUNTER — Other Ambulatory Visit
Admission: RE | Admit: 2021-01-16 | Discharge: 2021-01-16 | Disposition: A | Discharge status: Home / Self Care | Payer: Medicare PPO | Attending: Physician Assistant | Admitting: Physician Assistant

## 2021-01-16 DIAGNOSIS — Z7901 Long term (current) use of anticoagulants: Secondary | ICD-10-CM | POA: Diagnosis present

## 2021-01-16 LAB — CBC
HCT: 36 % — ABNORMAL LOW (ref 39.0–52.0)
Hemoglobin: 11.8 g/dL — ABNORMAL LOW (ref 13.0–17.0)
MCH: 35 pg — ABNORMAL HIGH (ref 26.0–34.0)
MCHC: 32.8 g/dL (ref 30.0–36.0)
MCV: 106.8 fL — ABNORMAL HIGH (ref 80.0–100.0)
Platelets: 131 10*3/uL — ABNORMAL LOW (ref 150–400)
RBC: 3.37 MIL/uL — ABNORMAL LOW (ref 4.22–5.81)
RDW: 14.1 % (ref 11.5–15.5)
WBC: 4 10*3/uL (ref 4.0–10.5)
nRBC: 0 % (ref 0.0–0.2)

## 2021-02-06 ENCOUNTER — Ambulatory Visit (INDEPENDENT_AMBULATORY_CARE_PROVIDER_SITE_OTHER): Payer: Medicare PPO

## 2021-02-06 ENCOUNTER — Other Ambulatory Visit: Payer: Self-pay

## 2021-02-06 DIAGNOSIS — R011 Cardiac murmur, unspecified: Secondary | ICD-10-CM | POA: Diagnosis not present

## 2021-02-06 LAB — ECHOCARDIOGRAM COMPLETE
AR max vel: 2.1 cm2
AV Area VTI: 1.86 cm2
AV Area mean vel: 1.79 cm2
AV Mean grad: 7.5 mmHg
AV Peak grad: 12.5 mmHg
Ao pk vel: 1.77 m/s
Area-P 1/2: 2.64 cm2
S' Lateral: 3 cm
Single Plane A4C EF: 58.7 %

## 2021-02-06 MED ORDER — PERFLUTREN LIPID MICROSPHERE
1.0000 mL | INTRAVENOUS | Status: AC | PRN
  Administered 2021-02-06: 2 mL via INTRAVENOUS

## 2021-06-11 ENCOUNTER — Other Ambulatory Visit: Payer: Self-pay

## 2021-06-11 ENCOUNTER — Encounter: Payer: Self-pay | Admitting: Cardiovascular Disease

## 2021-06-11 ENCOUNTER — Ambulatory Visit: Payer: Medicare PPO | Admitting: Cardiovascular Disease

## 2021-06-11 VITALS — BP 158/60 | HR 65 | Ht 72.0 in | Wt 183.2 lb

## 2021-06-11 DIAGNOSIS — I480 Paroxysmal atrial fibrillation: Secondary | ICD-10-CM

## 2021-06-11 NOTE — Patient Instructions (Signed)

## 2021-06-11 NOTE — Progress Notes (Signed)
Cardiology Office Note   Date:  06/11/2021   ID:  Gary Lane, DOB [birth year], MRN [id number]  PCP:  Marisue Ivan, MD  Cardiologist:   Lorine Bears, MD   Chief Complaint  Patient presents with   Other    D/c Eliquis no complains today. Meds reviewed verbally with pt.       History of Present Illness: Gary Lane is a 87 y.o. male who is here today for follow-up visit regarding paroxysmal atrial fibrillation and syncope. He had syncopal episodes in January of 2021 when he was found to be in A. fib with RVR with mildly elevated creatinine. Ventricular rate improved with diltiazem. He was diagnosed with UTI and was treated with antibiotics.   He was taking NSAIDs and Goody powder on a regular basis and these were discontinued.  Echocardiogram in February 2021 showed normal LV systolic function with no significant valvular abnormalities and normal atrial size.  He had an outpatient monitor done that showed short runs of wide-complex tachycardia likely aberrancy with intermittent A. fib but he was mostly in sinus rhythm. Diltiazem was subsequently discontinued due to hypotension.  The patient was noted to have gradually decreasing hemoglobin most recently 11.8 and also his platelet count decreased to 131,000.  He decided to stop Eliquis on his own in May because it was making him feel bad.  He could not explain in more details.  He is very hard of hearing.     Past Medical History:  Diagnosis Date   PAF (paroxysmal atrial fibrillation) (HCC)    Paroxysmal SVT (supraventricular tachycardia) (HCC)     Past Surgical History:  Procedure Laterality Date   APPENDECTOMY     BACK SURGERY     lumbar   CHOLECYSTECTOMY     ELBOW SURGERY Right    HERNIA REPAIR       Current Outpatient Medications  Medication Sig Dispense Refill   acetaminophen (TYLENOL) 500 MG tablet Take 500 mg by mouth 2 (two) times daily as needed.     cyanocobalamin 1000 MCG tablet Take 1,000  mcg by mouth daily.     GARLIC PO Take by mouth daily at 8 pm.     Glucosamine HCl (GLUCOSAMINE PO) Take by mouth daily at 2 am.     milk thistle 175 MG tablet Take 175 mg by mouth daily.     VITAMIN D PO Take by mouth daily at 8 pm.     apixaban (ELIQUIS) 5 MG TABS tablet Take 5 mg by mouth 2 (two) times daily. (Patient not taking: Reported on 06/11/2021)     No current facility-administered medications for this visit.    Allergies:   Patient has no known allergies.    Social History:  The patient  reports that he has never smoked. He has never used smokeless tobacco. He reports that he does not currently use alcohol. He reports that he does not currently use drugs.   Family History:  The patient's family history is negative for premature coronary artery disease.  His mother had a stroke.   ROS:  Please see the history of present illness.   Otherwise, review of systems are positive for none.   All other systems are reviewed and negative.    PHYSICAL EXAM: VS:  BP (!) 158/60 (BP Location: Left Arm, Patient Position: Sitting, Cuff Size: Normal)   Pulse 65   Ht 6' (1.829 m)   Wt 183 lb 4 oz (83.1 kg)   SpO2  97%   BMI 24.85 kg/m  , BMI Body mass index is 24.85 kg/m. GEN: Well nourished, well developed, in no acute distress  HEENT: normal  Neck: no JVD, carotid bruits, or masses Cardiac: Regular rate and rhythm; no  rubs, or gallops,no edema .  1 out of 6 systolic murmur at the base. Respiratory:  clear to auscultation bilaterally, normal work of breathing GI: soft, nontender, nondistended, + BS MS: no deformity or atrophy  Skin: warm and dry, no rash Neuro:  Strength and sensation are intact Psych: euthymic mood, full affect   EKG:  EKG is ordered today. The ekg ordered today demonstrates sinus rhythm with sinus arrhythmia and first-degree AV block.  Possible old inferior infarct.   Recent Labs: 01/09/2021: BUN 17; Creatinine, Ser 1.07; Potassium 4.1; Sodium 139 01/16/2021:  Hemoglobin 11.8; Platelets 131    Lipid Panel No results found for: CHOL, TRIG, HDL, CHOLHDL, VLDL, LDLCALC, LDLDIRECT    Wt Readings from Last 3 Encounters:  06/11/21 183 lb 4 oz (83.1 kg)  01/09/21 187 lb 8 oz (85 kg)  06/05/20 177 lb (80.3 kg)       PAD Screen 10/19/2019  Previous PAD dx? No  Previous surgical procedure? No  Pain with walking? Yes  Subsides with rest? Yes  Feet/toe relief with dangling? No  Painful, non-healing ulcers? No  Extremities discolored? No      ASSESSMENT AND PLAN:  1.  Syncope: Thought to be due to A. fib with RVR in the setting of volume depletion.  No further episodes.  2.  Paroxysmal atrial fibrillation: He is maintaining in sinus rhythm without any medication.  His CHA2DS2-VASc score is 2.  The patient does not want to go back on anticoagulation.  His son was present and I had a discussion with both of them about his annual risk of stroke.  The patient seems to understand and still declined anticoagulation.  3.  Abnormal kidney function: Improved after stopping NSAIDs.  Most recent creatinine was 1.3.   Disposition:   FU with me in 6 months  Signed,  Lorine Bears, MD  06/11/2021 4:03 PM    [facility]

## 2021-08-07 ENCOUNTER — Ambulatory Visit: Payer: Medicare PPO | Admitting: Physician Assistant

## 2021-11-05 ENCOUNTER — Ambulatory Visit: Payer: Medicare PPO | Admitting: Dermatology

## 2021-11-05 ENCOUNTER — Other Ambulatory Visit: Payer: Self-pay

## 2021-11-05 DIAGNOSIS — L578 Other skin changes due to chronic exposure to nonionizing radiation: Secondary | ICD-10-CM | POA: Diagnosis not present

## 2021-11-05 DIAGNOSIS — B079 Viral wart, unspecified: Secondary | ICD-10-CM

## 2021-11-05 DIAGNOSIS — L821 Other seborrheic keratosis: Secondary | ICD-10-CM

## 2021-11-05 DIAGNOSIS — L820 Inflamed seborrheic keratosis: Secondary | ICD-10-CM | POA: Diagnosis not present

## 2021-11-05 NOTE — Patient Instructions (Addendum)
Seborrheic Keratosis  What causes seborrheic keratoses? Seborrheic keratoses are harmless, common skin growths that first appear during adult life.  As time goes by, more growths appear.  Some people may develop a large number of them.  Seborrheic keratoses appear on both covered and uncovered body parts.  They are not caused by sunlight.  The tendency to develop seborrheic keratoses can be inherited.  They vary in color from skin-colored to gray, brown, or even black.  They can be either smooth or have a rough, warty surface.   Seborrheic keratoses are superficial and look as if they were stuck on the skin.  Under the microscope this type of keratosis looks like layers upon layers of skin.  That is why at times the top layer may seem to fall off, but the rest of the growth remains and re-grows.    Treatment Seborrheic keratoses do not need to be treated, but can easily be removed in the office.  Seborrheic keratoses often cause symptoms when they rub on clothing or jewelry.  Lesions can be in the way of shaving.  If they become inflamed, they can cause itching, soreness, or burning.  Removal of a seborrheic keratosis can be accomplished by freezing, burning, or surgery. If any spot bleeds, scabs, or grows rapidly, please return to have it checked, as these can be an indication of a skin cancer.   Prior to procedure, discussed risks of blister formation, small wound, skin dyspigmentation, or rare scar following cryotherapy. Recommend Vaseline ointment to treated areas while healing.   If You Need Anything After Your Visit  If you have any questions or concerns for your doctor, please call our main line at [phone] and press option 4 to reach your doctor's medical assistant. If no one answers, please leave a voicemail as directed and we will return your call as soon as possible. Messages left after 4 pm will be answered the following business day.   You may also send [place] a message via MyChart. We  typically respond to MyChart messages within 1-2 business days.  For prescription refills, please ask your pharmacy to contact our office. Our fax number is [phone].  If you have an urgent issue when the clinic is closed that cannot wait until the next business day, you can page your doctor at the number below.    Please note that while we do our best to be available for urgent issues outside of office hours, we are not available 24/7.   If you have an urgent issue and are unable to reach [place], you may choose to seek medical care at your doctor's office, retail clinic, urgent care center, or emergency room.  If you have a medical emergency, please immediately call 911 or go to the emergency department.  Pager Numbers  - Dr. Gwen Pounds: [phone]  - Dr. Neale Burly: [phone]  - Dr. Roseanne Reno: [phone]  In the event of inclement weather, please call our main line at [phone] for an update on the status of any delays or closures.  Dermatology Medication Tips: Please keep the boxes that topical medications come in in order to help keep track of the instructions about where and how to use these. Pharmacies typically print the medication instructions only on the boxes and not directly on the medication tubes.   If your medication is too expensive, please contact our office at [phone] option 4 or send [place] a message through MyChart.   We are unable to tell what your co-pay for  medications will be in advance as this is different depending on your insurance coverage. However, we may be able to find a substitute medication at lower cost or fill out paperwork to get insurance to cover a needed medication.   If a prior authorization is required to get your medication covered by your insurance company, please allow [place] 1-2 business days to complete this process.  Drug prices often vary depending on where the prescription is filled and some pharmacies may offer cheaper prices.  The  website www.goodrx.com contains coupons for medications through different pharmacies. The prices here do not account for what the cost may be with help from insurance (it may be cheaper with your insurance), but the website can give you the price if you did not use any insurance.  - You can print the associated coupon and take it with your prescription to the pharmacy.  - You may also stop by our office during regular business hours and pick up a GoodRx coupon card.  - If you need your prescription sent electronically to a different pharmacy, notify our office through [facility] or by phone at [phone] option 4.     Si Usted Necesita Algo Despus de Su Visita  Tambin puede enviarnos un mensaje a travs de Clinical cytogeneticist. Por lo general respondemos a los mensajes de MyChart en el transcurso de 1 a 2 das hbiles.  Para renovar recetas, por favor pida a su farmacia que se ponga en contacto con nuestra oficina. Annie Sable de fax es [place] [phone].  Si tiene un asunto urgente cuando la clnica est cerrada y que no puede esperar hasta el siguiente da hbil, puede llamar/localizar a su doctor(a) al nmero que aparece a continuacin.   Por favor, tenga en cuenta que aunque hacemos todo lo posible para estar disponibles para asuntos urgentes fuera del horario de [place], no estamos disponibles las 24 horas del da, los 7 [address]  [address] de la [place].   Si tiene un problema urgente y no puede comunicarse con nosotros, puede optar por buscar atencin mdica  en el consultorio de su doctor(a), en una clnica privada, en un centro de atencin urgente o en una sala de emergencias.  Si tiene Engineer, drilling, por favor llame inmediatamente al 911 o vaya a la sala de emergencias.  Nmeros de bper  - Dr. Gwen Pounds: [phone]  - Dra. Moye: [phone]  - Dra. Roseanne Reno: [phone]  En caso de inclemencias del [place], por favor llame a Lacy Duverney principal al [phone] para una  actualizacin sobre el [place] de cualquier retraso o cierre.  Consejos para la medicacin en dermatologa: Por favor, guarde las cajas en las que vienen los medicamentos de uso tpico para ayudarle a seguir las instrucciones sobre dnde y cmo usarlos. Las farmacias generalmente imprimen las instrucciones del medicamento slo en las cajas y no directamente en los tubos del [place].   Si su medicamento es muy caro, por favor, pngase en contacto con Rolm Gala llamando al [phone] y presione la opcin 4 o envenos un mensaje a travs de Clinical cytogeneticist.   No podemos decirle cul ser su copago por los medicamentos por adelantado ya que esto es diferente dependiendo de la cobertura de su seguro. Sin embargo, es posible que podamos encontrar un medicamento sustituto a Audiological scientist un formulario para que el seguro cubra el medicamento que se considera necesario.   Si se requiere una autorizacin previa para que su compaa de seguros [place] su medicamento, por favor permtanos  de 1 a 2 das hbiles para completar este proceso.  Los precios de los medicamentos varan con frecuencia dependiendo del Environmental consultant de dnde se surte la receta y alguna farmacias pueden ofrecer precios ms baratos.  El sitio web www.goodrx.com tiene cupones para medicamentos de Airline pilot. Los precios aqu no tienen en cuenta lo que podra costar con la ayuda del seguro (puede ser ms barato con su seguro), pero el sitio web puede darle el precio si no utiliz Research scientist (physical sciences).  - Puede imprimir el cupn correspondiente y llevarlo con su receta a la farmacia.  - Tambin puede pasar por nuestra oficina durante el horario de atencin regular y Charity fundraiser una tarjeta de cupones de GoodRx.  - Si necesita que su receta se enve electrnicamente a una farmacia diferente, informe a nuestra oficina a travs de MyChart de [place] o por telfono llamando al [phone] y presione la opcin 4.

## 2021-11-05 NOTE — Progress Notes (Signed)
New Patient Visit  Subjective  Gary Lane is a 88 y.o. male who presents for the following: New Patient (Initial Visit) (Patient here today concering a few spots at face he would like checked. Patient states he is bothered by some of the spots and picks at them. Patient denies personal or family history of skin cancer.). The patient presents for Upper Body Skin Exam (UBSE) for skin cancer screening and mole check.  The patient has spots, moles and lesions to be evaluated, some may be new or changing and the patient has concerns that these could be cancer.  The following portions of the chart were reviewed this encounter and updated as appropriate:   Tobacco   Allergies   Meds   Problems   Med Hx   Surg Hx   Fam Hx      Review of Systems:  No other skin or systemic complaints except as noted in HPI or Assessment and Plan.  Objective  Well appearing patient in no apparent distress; mood and affect are within normal limits.  A focused examination was performed including face, chest, arms, hands. Relevant physical exam findings are noted in the Assessment and Plan.  Skin waist up examined.  left sideburn area x 1, left pectoral x 1, right bicep x 1, right infraorbital x 1 (4) Erythematous stuck-on, waxy papule or plaque             right index finger x 1 Verrucous papules -- Discussed viral etiology and contagion.    Assessment & Plan  Inflamed seborrheic keratosis (4) left sideburn area x 1, left pectoral x 1, right bicep x 1, right infraorbital x 1  Will recheck areas at next follow up and retreat if necessary.   Destruction of lesion - left sideburn area x 1, left pectoral x 1, right bicep x 1, right infraorbital x 1 Complexity: simple   Destruction method: cryotherapy   Informed consent: discussed and consent obtained   Timeout:  patient name, date of birth, surgical site, and procedure verified Lesion destroyed using liquid nitrogen: Yes   Region frozen until  ice ball extended beyond lesion: Yes   Outcome: patient tolerated procedure well with no complications   Post-procedure details: wound care instructions given   Additional details:  Prior to procedure, discussed risks of blister formation, small wound, skin dyspigmentation, or rare scar following cryotherapy. Recommend Vaseline ointment to treated areas while healing.   Viral warts, unspecified type right index finger x 1  Discussed viral etiology and risk of spread.  Discussed multiple treatments may be required to clear warts.  Discussed possible post-treatment dyspigmentation and risk of recurrence.  Destruction of lesion - right index finger x 1 Complexity: simple   Destruction method: cryotherapy   Informed consent: discussed and consent obtained   Timeout:  patient name, date of birth, surgical site, and procedure verified Lesion destroyed using liquid nitrogen: Yes   Region frozen until ice ball extended beyond lesion: Yes   Outcome: patient tolerated procedure well with no complications   Post-procedure details: wound care instructions given    Seborrheic Keratoses - Stuck-on, waxy, tan-brown papules and/or plaques  - Benign-appearing - Discussed benign etiology and prognosis. - Observe - Call for any changes  Actinic Damage - chronic, secondary to cumulative UV radiation exposure/sun exposure over time - diffuse scaly erythematous macules with underlying dyspigmentation - Recommend daily broad spectrum sunscreen SPF 30+ to sun-exposed areas, reapply every 2 hours as needed.  - Recommend  staying in the shade or wearing long sleeves, sun glasses (UVA+UVB protection) and wide brim hats (4-inch brim around the entire circumference of the hat). - Call for new or changing lesions.  Return for 6 week isk and wart follow up.  IAsher Muir, CMA, am acting as scribe for Armida Sans, MD. Documentation: I have reviewed the above documentation for accuracy and completeness,  and I agree with the above.  Armida Sans, MD

## 2021-11-08 ENCOUNTER — Encounter: Payer: Self-pay | Admitting: Dermatology

## 2021-12-17 ENCOUNTER — Ambulatory Visit: Payer: Medicare PPO | Admitting: Dermatology

## 2022-01-01 ENCOUNTER — Ambulatory Visit: Payer: Medicare PPO | Admitting: Cardiovascular Disease

## 2022-01-05 IMAGING — DX DG CHEST 1V PORT
1 series · 1 of 1 positions shown · non-contrast
Comparison: None.

CLINICAL DATA: 89-year-old male with chest pain.

EXAM:
PORTABLE CHEST 1 VIEW

[chest ap]
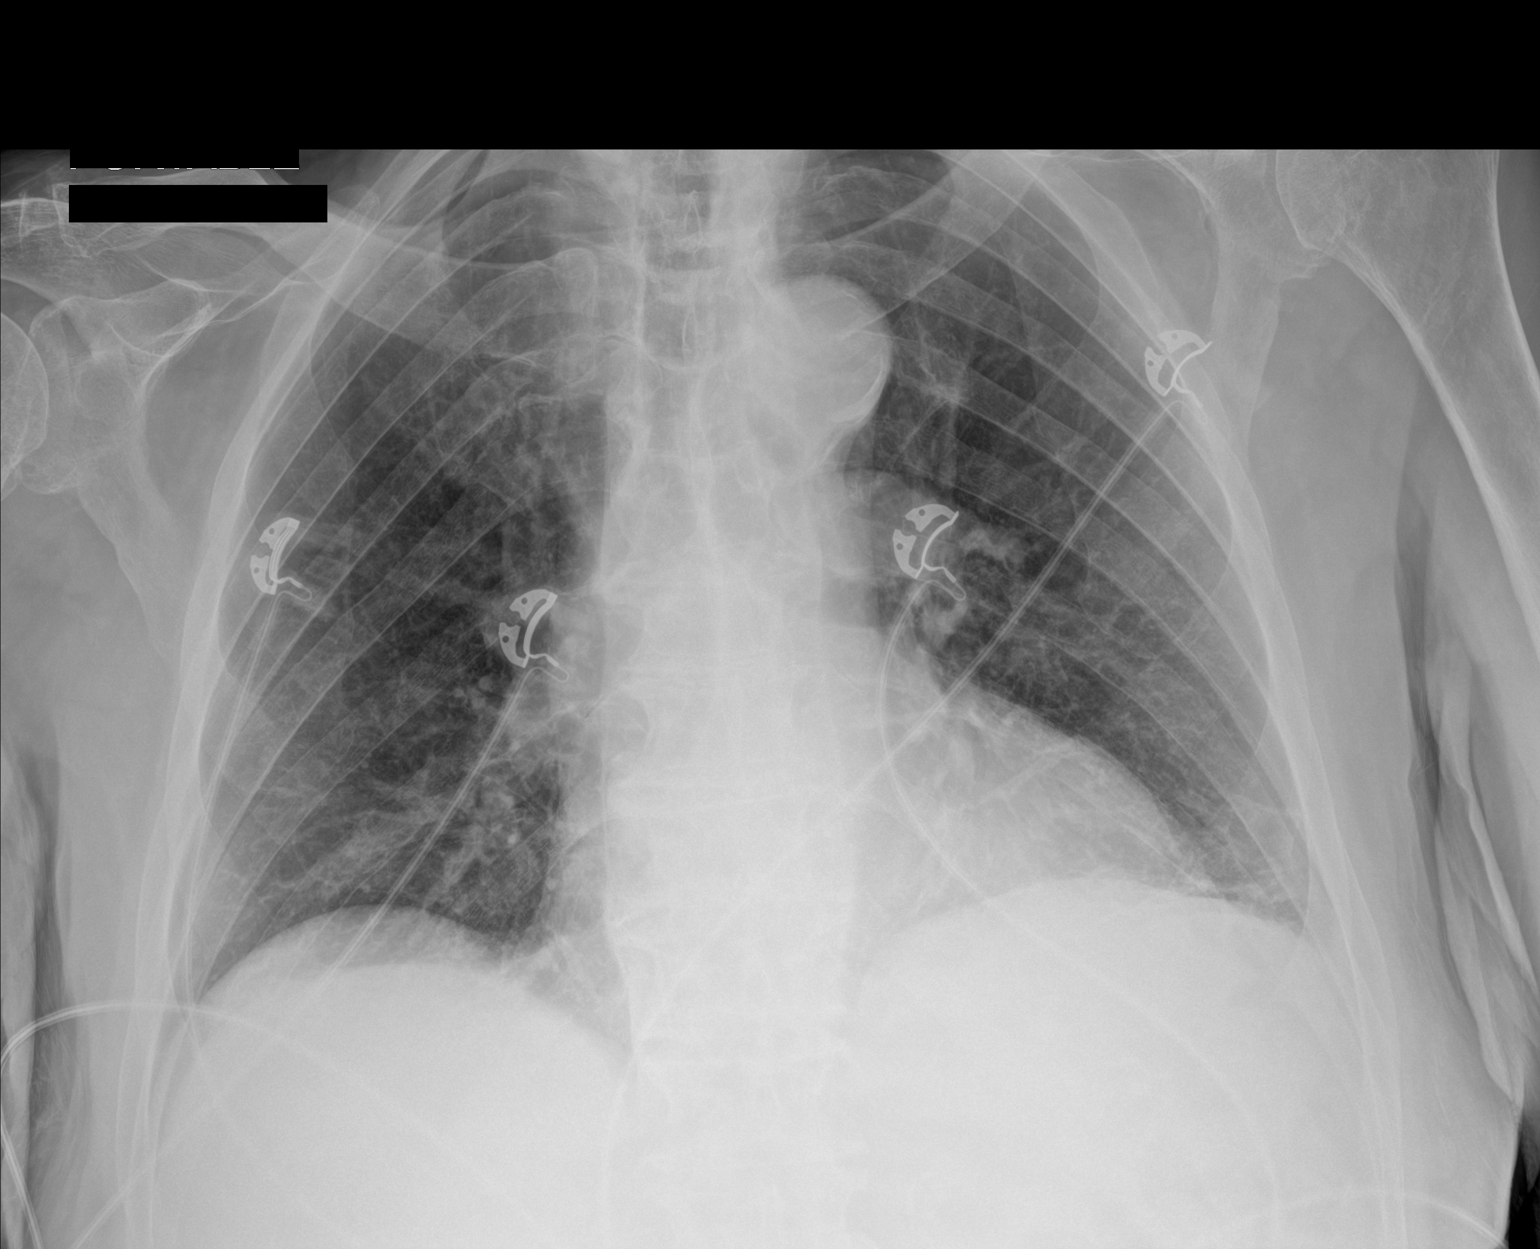

[1 of 1 positions shown; findings below may reference images not displayed]

FINDINGS: Minimal left lung base linear atelectasis/scarring. No focal
consolidation, pleural effusion, or pneumothorax. Top-normal cardiac
size. Atherosclerotic calcification of the aorta. No acute osseous
pathology.
IMPRESSION: No focal consolidation.

## 2022-01-21 ENCOUNTER — Ambulatory Visit: Payer: Medicare PPO | Admitting: Dermatology

## 2022-01-21 DIAGNOSIS — L821 Other seborrheic keratosis: Secondary | ICD-10-CM

## 2022-01-21 DIAGNOSIS — B079 Viral wart, unspecified: Secondary | ICD-10-CM

## 2022-01-21 DIAGNOSIS — L820 Inflamed seborrheic keratosis: Secondary | ICD-10-CM

## 2022-01-21 DIAGNOSIS — L578 Other skin changes due to chronic exposure to nonionizing radiation: Secondary | ICD-10-CM | POA: Diagnosis not present

## 2022-01-21 NOTE — Progress Notes (Signed)
? ?  Follow-Up Visit ?  ?Subjective  ?Gary Lane is a 88 y.o. male who presents for the following: Follow-up (Recheck ISK's on the L sideburn, L pectoral, R bicep, and R infraorbital as well as a wart on the R index finger - all lesions were treated with LN2. ). ?The patient has spots, moles and lesions to be evaluated, some may be new or changing and the patient has concerns that these could be cancer. ? ?The following portions of the chart were reviewed this encounter and updated as appropriate:  Tobacco  Allergies  Meds  Problems  Med Hx  Surg Hx  Fam Hx   ?  ? ?Objective  ?Well appearing patient in no apparent distress; mood and affect are within normal limits. ? ?A focused examination was performed including the face, trunk, and extremities. Relevant physical exam findings are noted in the Assessment and Plan. ? ?right index tip x 1 ?Verrucous papules -- Discussed viral etiology and contagion.  ? ?arms face and legs x 5 (5) ?Erythematous stuck-on, waxy papule or plaque ? ? ?Assessment & Plan  ?Viral warts, unspecified type ?right index tip x 1 ?Discussed viral etiology and risk of spread.  Discussed multiple treatments may be required to clear warts.  Discussed possible post-treatment dyspigmentation and risk of recurrence. ? ?Destruction of lesion - right index tip x 1 ?Complexity: simple   ?Destruction method: cryotherapy   ?Informed consent: discussed and consent obtained   ?Timeout:  patient name, date of birth, surgical site, and procedure verified ?Lesion destroyed using liquid nitrogen: Yes   ?Region frozen until ice ball extended beyond lesion: Yes   ?Outcome: patient tolerated procedure well with no complications   ?Post-procedure details: wound care instructions given   ?Additional details:  Prior to procedure, discussed risks of blister formation, small wound, skin dyspigmentation, or rare scar following cryotherapy. Recommend Vaseline ointment to treated areas while healing. ? ?Inflamed  seborrheic keratosis (5) ?arms face and legs x 5 ?Destruction of lesion - arms face and legs x 5 ?Complexity: simple   ?Destruction method: cryotherapy   ?Informed consent: discussed and consent obtained   ?Timeout:  patient name, date of birth, surgical site, and procedure verified ?Lesion destroyed using liquid nitrogen: Yes   ?Region frozen until ice ball extended beyond lesion: Yes   ?Outcome: patient tolerated procedure well with no complications   ?Post-procedure details: wound care instructions given   ?Additional details:  Prior to procedure, discussed risks of blister formation, small wound, skin dyspigmentation, or rare scar following cryotherapy. Recommend Vaseline ointment to treated areas while healing. ? ?Seborrheic Keratoses ?- Stuck-on, waxy, tan-brown papules and/or plaques  ?- Benign-appearing ?- Discussed benign etiology and prognosis. ?- Observe ?- Call for any changes ? ?Actinic Damage ?- chronic, secondary to cumulative UV radiation exposure/sun exposure over time ?- diffuse scaly erythematous macules with underlying dyspigmentation ?- Recommend daily broad spectrum sunscreen SPF 30+ to sun-exposed areas, reapply every 2 hours as needed.  ?- Recommend staying in the shade or wearing long sleeves, sun glasses (UVA+UVB protection) and wide brim hats (4-inch brim around the entire circumference of the hat). ?- Call for new or changing lesions. ? ?Return if symptoms worsen or fail to improve. ? ?I, Ruthell Rummage, CMA, am acting as scribe for Sarina Ser, MD. ?Documentation: I have reviewed the above documentation for accuracy and completeness, and I agree with the above. ? ?Sarina Ser, MD ? ? ?

## 2022-01-21 NOTE — Patient Instructions (Addendum)
Seborrheic Keratosis ? ?What causes seborrheic keratoses? ?Seborrheic keratoses are harmless, common skin growths that first appear during adult life.  As time goes by, more growths appear.  Some people may develop a large number of them.  Seborrheic keratoses appear on both covered and uncovered body parts.  They are not caused by sunlight.  The tendency to develop seborrheic keratoses can be inherited.  They vary in color from skin-colored to gray, brown, or even black.  They can be either smooth or have a rough, warty surface.   ?Seborrheic keratoses are superficial and look as if they were stuck on the skin.  Under the microscope this type of keratosis looks like layers upon layers of skin.  That is why at times the top layer may seem to fall off, but the rest of the growth remains and re-grows.   ? ?Treatment ?Seborrheic keratoses do not need to be treated, but can easily be removed in the office.  Seborrheic keratoses often cause symptoms when they rub on clothing or jewelry.  Lesions can be in the way of shaving.  If they become inflamed, they can cause itching, soreness, or burning.  Removal of a seborrheic keratosis can be accomplished by freezing, burning, or surgery. ?If any spot bleeds, scabs, or grows rapidly, please return to have it checked, as these can be an indication of a skin cancer. ? ? ?Cryotherapy Aftercare ? ?Wash gently with soap and water everyday.   ?Apply Vaseline and Band-Aid daily until healed.  ? ? ?If You Need Anything After Your Visit ? ?If you have any questions or concerns for your doctor, please call our main line at [phone] and press option 4 to reach your doctor's medical assistant. If no one answers, please leave a voicemail as directed and we will return your call as soon as possible. Messages left after 4 pm will be answered the following business day.  ? ?You may also send us a message via MyChart. We typically respond to MyChart messages within 1-2 business  days. ? ?For prescription refills, please ask your pharmacy to contact our office. Our fax number is [phone]. ? ?If you have an urgent issue when the clinic is closed that cannot wait until the next business day, you can page your doctor at the number below.   ? ?Please note that while we do our best to be available for urgent issues outside of office hours, we are not available 24/7.  ? ?If you have an urgent issue and are unable to reach us, you may choose to seek medical care at your doctor's office, retail clinic, urgent care center, or emergency room. ? ?If you have a medical emergency, please immediately call 911 or go to the emergency department. ? ?Pager Numbers ? ?- Dr. Kowalski: [phone] ? ?- Dr. Moye: [phone] ? ?- Dr. Stewart: [phone] ? ?In the event of inclement weather, please call our main line at [phone] for an update on the status of any delays or closures. ? ?Dermatology Medication Tips: ?Please keep the boxes that topical medications come in in order to help keep track of the instructions about where and how to use these. Pharmacies typically print the medication instructions only on the boxes and not directly on the medication tubes.  ? ?If your medication is too expensive, please contact our office at [phone] option 4 or send us a message through MyChart.  ? ?We are unable to tell what your co-pay for medications will be in advance   as this is different depending on your insurance coverage. However, we may be able to find a substitute medication at lower cost or fill out paperwork to get insurance to cover a needed medication.  ? ?If a prior authorization is required to get your medication covered by your insurance company, please allow us 1-2 business days to complete this process. ? ?Drug prices often vary depending on where the prescription is filled and some pharmacies may offer cheaper prices. ? ?The website www.goodrx.com contains coupons for medications through  different pharmacies. The prices here do not account for what the cost may be with help from insurance (it may be cheaper with your insurance), but the website can give you the price if you did not use any insurance.  ?- You can print the associated coupon and take it with your prescription to the pharmacy.  ?- You may also stop by our office during regular business hours and pick up a GoodRx coupon card.  ?- If you need your prescription sent electronically to a different pharmacy, notify our office through [facility] MyChart or by phone at [phone] option 4. ? ? ? ? ?Si Usted Necesita Algo Despu?s de Su Visita ? ?Tambi?n puede enviarnos un mensaje a trav?s de MyChart. Por lo general respondemos a los mensajes de MyChart en el transcurso de 1 a 2 d?as h?biles. ? ?Para renovar recetas, por favor pida a su farmacia que se ponga en contacto con nuestra oficina. Nuestro n?mero de fax es el [phone]. ? ?Si tiene un asunto urgente cuando la cl?nica est? cerrada y que no puede esperar hasta el siguiente d?a h?bil, puede llamar/localizar a su doctor(a) al n?mero que aparece a continuaci?n.  ? ?Por favor, tenga en cuenta que aunque hacemos todo lo posible para estar disponibles para asuntos urgentes fuera del horario de oficina, no estamos disponibles las 24 horas del d?a, los 7 d?as de la semana.  ? ?Si tiene un problema urgente y no puede comunicarse con nosotros, puede optar por buscar atenci?n m?dica  en el consultorio de su doctor(a), en una cl?nica privada, en un centro de atenci?n urgente o en una sala de emergencias. ? ?Si tiene una emergencia m?dica, por favor llame inmediatamente al 911 o vaya a la sala de emergencias. ? ?N?meros de b?per ? ?- Dr. Kowalski: [phone] ? ?- Dra. Moye: [phone] ? ?- Dra. Stewart: [phone] ? ?En caso de inclemencias del tiempo, por favor llame a nuestra l?nea principal al [phone] para una actualizaci?n sobre el estado de cualquier retraso o cierre. ? ?Consejos  para la medicaci?n en dermatolog?a: ?Por favor, guarde las cajas en las que vienen los medicamentos de uso t?pico para ayudarle a seguir las instrucciones sobre d?nde y c?mo usarlos. Las farmacias generalmente imprimen las instrucciones del medicamento s?lo en las cajas y no directamente en los tubos del medicamento.  ? ?Si su medicamento es muy caro, por favor, p?ngase en contacto con nuestra oficina llamando al [phone] y presione la opci?n 4 o env?enos un mensaje a trav?s de MyChart.  ? ?No podemos decirle cu?l ser? su copago por los medicamentos por adelantado ya que esto es diferente dependiendo de la cobertura de su seguro. Sin embargo, es posible que podamos encontrar un medicamento sustituto a menor costo o llenar un formulario para que el seguro cubra el medicamento que se considera necesario.  ? ?Si se requiere una autorizaci?n previa para que su compa??a de seguros cubra su medicamento, por favor perm?tanos de 1 a 2 d?as   h?biles para completar este proceso. ? ?Los precios de los medicamentos var?an con frecuencia dependiendo del lugar de d?nde se surte la receta y alguna farmacias pueden ofrecer precios m?s baratos. ? ?El sitio web www.goodrx.com tiene cupones para medicamentos de diferentes farmacias. Los precios aqu? no tienen en cuenta lo que podr?a costar con la ayuda del seguro (puede ser m?s barato con su seguro), pero el sitio web puede darle el precio si no utiliz? ning?n seguro.  ?- Puede imprimir el cup?n correspondiente y llevarlo con su receta a la farmacia.  ?- Tambi?n puede pasar por nuestra oficina durante el horario de atenci?n regular y recoger una tarjeta de cupones de GoodRx.  ?- Si necesita que su receta se env?e electr?nicamente a una farmacia diferente, informe a nuestra oficina a trav?s de MyChart de [place] o por tel?fono llamando al [phone] y presione la opci?n 4. ? ?

## 2022-01-30 ENCOUNTER — Encounter: Payer: Self-pay | Admitting: Dermatology

## 2022-02-12 ENCOUNTER — Ambulatory Visit: Payer: Medicare PPO | Admitting: Cardiovascular Disease

## 2022-02-12 ENCOUNTER — Encounter: Payer: Self-pay | Admitting: Cardiovascular Disease

## 2022-02-12 VITALS — BP 126/74 | HR 98 | Ht 72.0 in | Wt 188.0 lb

## 2022-02-12 DIAGNOSIS — I480 Paroxysmal atrial fibrillation: Secondary | ICD-10-CM

## 2022-02-12 NOTE — Patient Instructions (Signed)
Medication Instructions:  ?Your physician recommends that you continue on your current medications as directed. Please refer to the Current Medication list given to you today. ? ?*If you need a refill on your cardiac medications before your next appointment, please call your pharmacy* ? ? ?Lab Work: ?None orderd ?If you have labs (blood work) drawn today and your tests are completely normal, you will receive your results only by: ?MyChart Message (if you have MyChart) OR ?A paper copy in the mail ?If you have any lab test that is abnormal or we need to change your treatment, we will call you to review the results. ? ? ?Testing/Procedures: ?None ordered ? ? ?Follow-Up: ?At [facility], you and your health needs are our priority.  As part of our continuing mission to provide you with exceptional heart care, we have created designated Provider Care Teams.  These Care Teams include your primary Cardiologist (physician) and Advanced Practice Providers (APPs -  Physician Assistants and Nurse Practitioners) who all work together to provide you with the care you need, when you need it. ? ?We recommend signing up for the patient portal called "MyChart".  Sign up information is provided on this After Visit Summary.  MyChart is used to connect with patients for Virtual Visits (Telemedicine).  Patients are able to view lab/test results, encounter notes, upcoming appointments, etc.  Non-urgent messages can be sent to your provider as well.   ?To learn more about what you can do with MyChart, go to [email or web address].   ? ?Your next appointment:   ?6 month(s) ? ?The format for your next appointment:   ?In Person ? ?Provider:   ?You may see Lorine Bears, MD or one of the following Advanced Practice Providers on your designated Care Team:   ?Nicolasa Ducking, NP ?Eula Listen, PA-C ?Cadence Fransico Michael, PA-C ? ? ?Other Instructions ?N/A ? ?Important Information About Sugar ? ? ? ? ? ? ?

## 2022-02-12 NOTE — Progress Notes (Signed)
?  ?Cardiology Office Note ? ? ?Date:  02/12/2022  ? ?ID:  Gary Lane, DOB [birth year], MRN [id number] ? ?PCP:  Marisue Ivan, MD  ?Cardiologist:   Lorine Bears, MD  ? ?Chief Complaint  ?Patient presents with  ? Other  ?  6 month f/u no complaints today. Meds reviewed verbally with pt.  ? ? ? ?  ?History of Present Illness: ?Gary Lane is a 88 y.o. male who is here today for follow-up visit regarding paroxysmal atrial fibrillation and syncope. ?He had syncopal episodes in January of 2021 when he was found to be in A. fib with RVR with mildly elevated creatinine. Ventricular rate improved with diltiazem. He was diagnosed with UTI and was treated with antibiotics.   ?He was taking NSAIDs and Goody powder on a regular basis and these were discontinued.  ?Echocardiogram in February 2021 showed normal LV systolic function with no significant valvular abnormalities and normal atrial size.  He had an outpatient monitor done that showed short runs of wide-complex tachycardia likely aberrancy with intermittent A. fib but he was mostly in sinus rhythm. ?Diltiazem was subsequently discontinued due to hypotension.   ?The patient was noted to have gradually decreasing hemoglobin most recently 11.8 and also his platelet count decreased to 131,000.  He decided to stop Eliquis on his own in May because it was making him feel bad.  He declined anticoagulation. ? ?Symptomatically, he has been doing well with no chest pain, shortness of breath or palpitations.  He is noted to be in atrial fibrillation today but he denies symptoms. ? ? ? ? ?Past Medical History:  ?Diagnosis Date  ? PAF (paroxysmal atrial fibrillation) (HCC)   ? Paroxysmal SVT (supraventricular tachycardia) (HCC)   ? ? ?Past Surgical History:  ?Procedure Laterality Date  ? APPENDECTOMY    ? BACK SURGERY    ? lumbar  ? CHOLECYSTECTOMY    ? ELBOW SURGERY Right   ? HERNIA REPAIR    ? ? ? ?Current Outpatient Medications  ?Medication Sig Dispense Refill  ?  acetaminophen (TYLENOL) 500 MG tablet Take 500 mg by mouth 2 (two) times daily as needed.    ? cyanocobalamin 1000 MCG tablet Take 1,000 mcg by mouth daily.    ? GARLIC PO Take by mouth daily at 8 pm.    ? Glucosamine HCl (GLUCOSAMINE PO) Take by mouth daily at 2 am.    ? milk thistle 175 MG tablet Take 175 mg by mouth daily.    ? VITAMIN D PO Take by mouth daily at 8 pm.    ? ?No current facility-administered medications for this visit.  ? ? ?Allergies:   Patient has no known allergies.  ? ? ?Social History:  The patient  reports that he has never smoked. He has never used smokeless tobacco. He reports that he does not currently use alcohol. He reports that he does not currently use drugs.  ? ?Family History:  The patient's family history is negative for premature coronary artery disease.  His mother had a stroke. ? ? ?ROS:  Please see the history of present illness.   Otherwise, review of systems are positive for none.   All other systems are reviewed and negative.  ? ? ?PHYSICAL EXAM: ?VS:  BP 126/74 (BP Location: Left Arm, Patient Position: Sitting, Cuff Size: Normal)   Pulse 98   Ht 6' (1.829 m)   Wt 188 lb (85.3 kg)   SpO2 96%   BMI 25.50 kg/m?  ,  BMI Body mass index is 25.5 kg/m?. ?GEN: Well nourished, well developed, in no acute distress  ?HEENT: normal  ?Neck: no JVD, carotid bruits, or masses ?Cardiac: Irregularly irregular; no  rubs, or gallops,no edema .  1/ 6 systolic murmur at the base. ?Respiratory:  clear to auscultation bilaterally, normal work of breathing ?GI: soft, nontender, nondistended, + BS ?MS: no deformity or atrophy  ?Skin: warm and dry, no rash ?Neuro:  Strength and sensation are intact ?Psych: euthymic mood, full affect ? ? ?EKG:  EKG is ordered today. ?The ekg ordered today demonstrates atrial fibrillation with rapid ventricular response.  Ventricular rate is 98 bpm.  Low voltage. ? ? ?Recent Labs: ?No results found for requested labs within last 8760 hours.  ? ? ?Lipid Panel ?No  results found for: CHOL, TRIG, HDL, CHOLHDL, VLDL, LDLCALC, LDLDIRECT ?  ? ?Wt Readings from Last 3 Encounters:  ?02/12/22 188 lb (85.3 kg)  ?06/11/21 183 lb 4 oz (83.1 kg)  ?01/09/21 187 lb 8 oz (85 kg)  ?  ? ? ? ? ?  10/19/2019  ?  3:14 PM  ?PAD Screen  ?Previous PAD dx? No  ?Previous surgical procedure? No  ?Pain with walking? Yes  ?Subsides with rest? Yes  ?Feet/toe relief with dangling? No  ?Painful, non-healing ulcers? No  ?Extremities discolored? No  ? ? ? ? ?ASSESSMENT AND PLAN: ? ?1.  Syncope: Thought to be due to A. fib with RVR in the setting of volume depletion.  No further episodes. ? ?2.  Paroxysmal atrial fibrillation: He is noted to be in atrial fibrillation today but he is asymptomatic.   His CHA2DS2-VASc score is 2.  The patient continues to decline anticoagulation.  He understands the risk of stroke. ?If ventricular rate increases or he becomes symptomatic, we can consider a small dose metoprolol. ? ?3.  Abnormal kidney function: Improved after stopping NSAIDs.  Most recent creatinine was 1.2. ? ? ?Disposition:   FU with me in 6 months ? ?Signed, ? ?Lorine Bears, MD  ?02/12/2022 9:32 AM    ?[facility] ?

## 2022-02-16 ENCOUNTER — Encounter: Payer: Self-pay | Admitting: Cardiovascular Disease

## 2022-02-18 ENCOUNTER — Other Ambulatory Visit: Payer: Self-pay | Admitting: *Deleted

## 2022-02-18 MED ORDER — RIVAROXABAN 15 MG PO TABS: 15.0000 mg | ORAL_TABLET | Freq: Every day | ORAL | 1 refills | Status: DC

## 2022-02-18 MED ORDER — METOPROLOL TARTRATE 25 MG PO TABS: 12.5000 mg | ORAL_TABLET | Freq: Two times a day (BID) | ORAL | 0 refills | Status: DC

## 2022-02-18 MED ORDER — METOPROLOL TARTRATE 25 MG PO TABS: 12.5000 mg | ORAL_TABLET | Freq: Two times a day (BID) | ORAL | 1 refills | Status: DC

## 2022-02-18 NOTE — Telephone Encounter (Signed)
I recommend metoprolol tartrate 12.5 mg twice daily and Xarelto 15 mg once daily with food.

## 2022-08-20 ENCOUNTER — Ambulatory Visit: Payer: Medicare PPO | Admitting: Cardiovascular Disease

## 2022-08-31 ENCOUNTER — Ambulatory Visit: Payer: Medicare PPO | Attending: Cardiovascular Disease | Admitting: Cardiovascular Disease

## 2022-08-31 ENCOUNTER — Encounter: Payer: Self-pay | Admitting: Cardiovascular Disease

## 2022-08-31 VITALS — BP 120/76 | HR 92 | Ht 72.0 in | Wt 180.1 lb

## 2022-08-31 DIAGNOSIS — I4811 Longstanding persistent atrial fibrillation: Secondary | ICD-10-CM | POA: Diagnosis not present

## 2022-08-31 NOTE — Patient Instructions (Signed)
Medication Instructions:  No changes *If you need a refill on your cardiac medications before your next appointment, please call your pharmacy*   Lab Work: None ordered If you have labs (blood work) drawn today and your tests are completely normal, you will receive your results only by: MyChart Message (if you have MyChart) OR A paper copy in the mail If you have any lab test that is abnormal or we need to change your treatment, we will call you to review the results.   Testing/Procedures: None ordered   Follow-Up: At [facility], you and your health needs are our priority.  As part of our continuing mission to provide you with exceptional heart care, we have created designated Provider Care Teams.  These Care Teams include your primary Cardiologist (physician) and Advanced Practice Providers (APPs -  Physician Assistants and Nurse Practitioners) who all work together to provide you with the care you need, when you need it.  We recommend signing up for the patient portal called "MyChart".  Sign up information is provided on this After Visit Summary.  MyChart is used to connect with patients for Virtual Visits (Telemedicine).  Patients are able to view lab/test results, encounter notes, upcoming appointments, etc.  Non-urgent messages can be sent to your provider as well.   To learn more about what you can do with MyChart, go to [email or web address].    Your next appointment:   As needed with Dr. Kirke Corin  Important Information About Sugar

## 2022-08-31 NOTE — Progress Notes (Signed)
Cardiology Office Note   Date:  08/31/2022   ID:  Gary Lane, DOB 03/20/1930, MRN [id number]  PCP:  Marisue Ivan, MD  Cardiologist:   Lorine Bears, MD   Chief Complaint  Patient presents with   OTHER    6 Month fu pt d/c metoprolol and xarelto x6 month per pt. Meds reviewed verbally with pt.       History of Present Illness: Gary Lane is a 88 y.o. male who is here today for follow-up visit regarding paroxysmal atrial fibrillation and syncope. He had syncopal episodes in January of 2021 when he was found to be in A. fib with RVR with mildly elevated creatinine. Ventricular rate improved with diltiazem. He was diagnosed with UTI and was treated with antibiotics.   He was taking NSAIDs and Goody powder on a regular basis and these were discontinued.  Echocardiogram in February 2021 showed normal LV systolic function with no significant valvular abnormalities and normal atrial size.  He had an outpatient monitor done that showed short runs of wide-complex tachycardia likely aberrancy with intermittent A. fib but he was mostly in sinus rhythm. Diltiazem was subsequently discontinued due to hypotension.    The patient was initially anticoagulated with Eliquis but then he stopped the medication on his own as " it made him feel bad".  During last visit, he agreed to take Xarelto for anticoagulation but then changed his mind.  He was started on small dose metoprolol for rate control but also decided not to take the medication.  He denies chest pain, dizziness or syncope.  He remains active overall.     Past Medical History:  Diagnosis Date   PAF (paroxysmal atrial fibrillation) (HCC)    Paroxysmal SVT (supraventricular tachycardia)     Past Surgical History:  Procedure Laterality Date   APPENDECTOMY     BACK SURGERY     lumbar   CHOLECYSTECTOMY     ELBOW SURGERY Right    HERNIA REPAIR       Current Outpatient Medications  Medication Sig Dispense Refill    acetaminophen (TYLENOL) 500 MG tablet Take 500 mg by mouth 2 (two) times daily as needed.     cyanocobalamin 1000 MCG tablet Take 1,000 mcg by mouth daily.     GARLIC PO Take by mouth daily at 8 pm.     Glucosamine HCl (GLUCOSAMINE PO) Take by mouth daily at 2 am.     milk thistle 175 MG tablet Take 175 mg by mouth daily.     VITAMIN D PO Take by mouth daily at 8 pm.     metoprolol tartrate (LOPRESSOR) 25 MG tablet Take 0.5 tablets (12.5 mg total) by mouth 2 (two) times daily. (Patient not taking: Reported on 08/31/2022) 90 tablet 0   Rivaroxaban (XARELTO) 15 MG TABS tablet Take 1 tablet (15 mg total) by mouth daily with supper. (Patient not taking: Reported on 08/31/2022) 30 tablet 1   No current facility-administered medications for this visit.    Allergies:   Patient has no known allergies.    Social History:  The patient  reports that he has never smoked. He has never used smokeless tobacco. He reports that he does not currently use alcohol. He reports that he does not currently use drugs.   Family History:  The patient's family history is negative for premature coronary artery disease.  His mother had a stroke.   ROS:  Please see the history of present illness.   Otherwise,  review of systems are positive for none.   All other systems are reviewed and negative.    PHYSICAL EXAM: VS:  BP 120/76 (BP Location: Left Arm, Patient Position: Sitting, Cuff Size: Normal)   Pulse 92   Ht 6' (1.829 m)   Wt 180 lb 2 oz (81.7 kg)   SpO2 98%   BMI 24.43 kg/m  , BMI Body mass index is 24.43 kg/m. GEN: Well nourished, well developed, in no acute distress  HEENT: normal  Neck: no JVD, carotid bruits, or masses Cardiac: Irregularly irregular; no  rubs, or gallops,no edema .  1/ 6 systolic murmur at the base. Respiratory:  clear to auscultation bilaterally, normal work of breathing GI: soft, nontender, nondistended, + BS MS: no deformity or atrophy  Skin: warm and dry, no rash Neuro:   Strength and sensation are intact Psych: euthymic mood, full affect   EKG:  EKG is ordered today. The ekg ordered today demonstrates atrial fibrillation with low voltage.  Ventricular rate is 92 beats per minute.  Recent Labs: No results found for requested labs within last 365 days.    Lipid Panel No results found for: "CHOL", "TRIG", "HDL", "CHOLHDL", "VLDL", "LDLCALC", "LDLDIRECT"    Wt Readings from Last 3 Encounters:  08/31/22 180 lb 2 oz (81.7 kg)  02/12/22 188 lb (85.3 kg)  06/11/21 183 lb 4 oz (83.1 kg)          10/19/2019    3:14 PM  PAD Screen  Previous PAD dx? No  Previous surgical procedure? No  Pain with walking? Yes  Subsides with rest? Yes  Feet/toe relief with dangling? No  Painful, non-healing ulcers? No  Extremities discolored? No      ASSESSMENT AND PLAN:  1.  Syncope: Thought to be due to A. fib with RVR in the setting of volume depletion.  No further episodes.  2.  Persistent atrial fibrillation: He is likely transitioning into permanent atrial fibrillation.    His CHA2DS2-VASc score is 2.  The patient continues to decline anticoagulation.  He understands the risk of stroke.  His ventricular rate seems to be controlled without medications.  3.  Abnormal kidney function: Improved after stopping NSAIDs.  Most recent creatinine was 1.3.    Disposition:   He can follow-up with [place] as needed if he develops cardiac symptoms or decides to take anticoagulation.  Signed,  Lorine Bears, MD  08/31/2022 3:37 PM    [facility]

## 2023-10-31 ENCOUNTER — Telehealth (INDEPENDENT_AMBULATORY_CARE_PROVIDER_SITE_OTHER): Payer: Self-pay

## 2023-10-31 NOTE — Telephone Encounter (Signed)
Patient son called to check on referral. Can you let me inform me if has came in or not.

## 2023-10-31 NOTE — Telephone Encounter (Signed)
Spoke with pt's son and advised that I have not gotten a chance to go through all referrals that came in on Thursday and Friday of last week. I would look tomorrow and give a call back to him. Son acknowledged.

## 2023-11-02 ENCOUNTER — Telehealth (INDEPENDENT_AMBULATORY_CARE_PROVIDER_SITE_OTHER): Payer: Self-pay

## 2023-11-02 NOTE — Telephone Encounter (Signed)
Trey Paula called on behalf of Gary Lane about his referral. He's very concerned about the blood flow circulation in his legs.  Can someone call and make him an appointment.

## 2023-11-25 ENCOUNTER — Other Ambulatory Visit (INDEPENDENT_AMBULATORY_CARE_PROVIDER_SITE_OTHER): Payer: Self-pay | Admitting: Nurse Practitioner

## 2023-11-25 DIAGNOSIS — I739 Peripheral vascular disease, unspecified: Secondary | ICD-10-CM

## 2023-11-29 ENCOUNTER — Ambulatory Visit (INDEPENDENT_AMBULATORY_CARE_PROVIDER_SITE_OTHER): Payer: Medicare PPO

## 2023-11-29 ENCOUNTER — Encounter (INDEPENDENT_AMBULATORY_CARE_PROVIDER_SITE_OTHER): Payer: Self-pay | Admitting: Vascular Surgery

## 2023-11-29 ENCOUNTER — Ambulatory Visit (INDEPENDENT_AMBULATORY_CARE_PROVIDER_SITE_OTHER): Payer: Medicare PPO | Admitting: Vascular Surgery

## 2023-11-29 VITALS — BP 119/65 | HR 71 | Resp 16 | Ht 72.0 in | Wt 165.0 lb

## 2023-11-29 DIAGNOSIS — R230 Cyanosis: Secondary | ICD-10-CM

## 2023-11-29 DIAGNOSIS — I480 Paroxysmal atrial fibrillation: Secondary | ICD-10-CM

## 2023-11-29 DIAGNOSIS — I739 Peripheral vascular disease, unspecified: Secondary | ICD-10-CM

## 2023-11-29 DIAGNOSIS — I4821 Permanent atrial fibrillation: Secondary | ICD-10-CM | POA: Insufficient documentation

## 2023-11-29 NOTE — Assessment & Plan Note (Signed)
 His noninvasive studies today showed noncompressible ABIs which are falsely elevated but his waveforms are triphasic down to the ankles.  He does have diminished digital waveforms bilaterally with a digit pressure on the right 49 and on the left of 72.  I think a significant proportion of his purplish discoloration of the feet is poor venous return and likely some component of congestion from poor cardiac function.  No limb threatening or worrisome symptoms at current.  We did discuss the importance of maintaining good footcare and avoiding wounds as these would be slow to heal with a small vessel disease.  No plan for intervention at this time.  Patient is on aspirin daily which is reasonable from my perspective.  Follow-up in 6 months with noninvasive studies.

## 2023-11-29 NOTE — Progress Notes (Signed)
 Patient ID: Gary CUDWORTH, male   DOB: 19-Dec-1929, 89 y.o.   MRN: [id number]  Chief Complaint  Patient presents with   New Patient (Initial Visit)    Ref Gary Lane consult PAD    HPI Gary Lane is a 89 y.o. male.  I am asked to see the patient by Dr. Burnadette Lane for evaluation of marked purple discoloration of his feet worse on the left than the right.  This is not overtly painful to the patient.  He has had some scabs or small wounds on the feet that were slow to heal but ultimately did heal up.  He does not have any pain that wakes him from sleep.  He denies any fever or chills.  He was previously very active but over the past 6 months has become less active.  He says some of that is related to the poor weather.  He denies disabling claudication symptoms when he ambulates.  His noninvasive studies today showed noncompressible ABIs which are falsely elevated but his waveforms are triphasic down to the ankles.  He does have diminished digital waveforms bilaterally with a digit pressure on the right 49 and on the left of 72.     Past Medical History:  Diagnosis Date   PAF (paroxysmal atrial fibrillation) (HCC)    Paroxysmal SVT (supraventricular tachycardia) (HCC)     Past Surgical History:  Procedure Laterality Date   APPENDECTOMY     BACK SURGERY     lumbar   CHOLECYSTECTOMY     ELBOW SURGERY Right    HERNIA REPAIR       Family History  Problem Relation Age of Onset   Stroke Mother   No bleeding or clotting disorders No aneurysms   Social History   Tobacco Use   Smoking status: Never   Smokeless tobacco: Never  Vaping Use   Vaping status: Never Used  Substance Use Topics   Alcohol use: Not Currently   Drug use: Not Currently     No Known Allergies  Current Outpatient Medications  Medication Sig Dispense Refill   acetaminophen (TYLENOL) 500 MG tablet Take 500 mg by mouth 2 (two) times daily as needed.     aspirin EC 81 MG tablet Take 81 mg by mouth  daily.     cyanocobalamin 1000 MCG tablet Take 1,000 mcg by mouth daily.     GARLIC PO Take by mouth daily at 8 pm.     Glucosamine HCl (GLUCOSAMINE PO) Take by mouth daily at 2 am.     Magnesium Gluconate 500 (27 Mg) MG TABS Take 500 mg by mouth daily.     milk thistle 175 MG tablet Take 175 mg by mouth daily.     VITAMIN D PO Take by mouth daily at 8 pm.     metoprolol tartrate (LOPRESSOR) 25 MG tablet Take 0.5 tablets (12.5 mg total) by mouth 2 (two) times daily. (Patient not taking: Reported on 11/29/2023) 90 tablet 0   Rivaroxaban (XARELTO) 15 MG TABS tablet Take 1 tablet (15 mg total) by mouth daily with supper. (Patient not taking: Reported on 11/29/2023) 30 tablet 1   No current facility-administered medications for this visit.      REVIEW OF SYSTEMS (Negative unless checked)  Constitutional: [] Weight loss  [] Fever  [] Chills Cardiac: [] Chest pain   [] Chest pressure   [x] Palpitations   [] Shortness of breath when laying flat   [] Shortness of breath at rest   [x] Shortness of breath with exertion. Vascular:  []   Pain in legs with walking   [] Pain in legs at rest   [] Pain in legs when laying flat   [] Claudication   [] Pain in feet when walking  [] Pain in feet at rest  [] Pain in feet when laying flat   [] History of DVT   [] Phlebitis   [] Swelling in legs   [] Varicose veins   [] Non-healing ulcers Pulmonary:   [] Uses home oxygen   [] Productive cough   [] Hemoptysis   [] Wheeze  [] COPD   [] Asthma Neurologic:  [] Dizziness  [] Blackouts   [] Seizures   [] History of stroke   [] History of TIA  [] Aphasia   [] Temporary blindness   [] Dysphagia   [] Weakness or numbness in arms   [] Weakness or numbness in legs Musculoskeletal:  [] Arthritis   [] Joint swelling   [] Joint pain   [] Low back pain Hematologic:  [] Easy bruising  [] Easy bleeding   [] Hypercoagulable state   [] Anemic  [] Hepatitis Gastrointestinal:  [] Blood in stool   [] Vomiting blood  [] Gastroesophageal reflux/heartburn   [] Abdominal pain Genitourinary:   [] Chronic kidney disease   [] Difficult urination  [] Frequent urination  [] Burning with urination   [] Hematuria Skin:  [] Rashes   [] Ulcers   [] Wounds Psychological:  [] History of anxiety   []  History of major depression.    Physical Exam BP 119/65   Pulse 71   Resp 16   Ht 6' (1.829 m)   Wt 165 lb (74.8 kg)   BMI 22.38 kg/m  Gen:  WD/WN, NAD. Appears younger than stated age. Head: [place]/AT, No temporalis wasting.  Ear/Nose/Throat: Hearing grossly intact but diminished, nares w/o erythema or drainage, oropharynx w/o Erythema/Exudate Eyes: Conjunctiva clear, sclera non-icteric  Neck: trachea midline.  No JVD.  Pulmonary:  Good air movement, respirations not labored, no use of accessory muscles  Cardiac: irregular Vascular:  Vessel Right Left  Radial Palpable Palpable                          PT 1+ 2+  DP 1+ 1+   Gastrointestinal:. No masses, surgical incisions, or scars. Musculoskeletal: M/S 5/5 throughout.  Extremities with purplish discoloration of both feet but with quick capillary refill and no open wounds..  No deformity or atrophy. Trace LE edema. Neurologic: Sensation grossly intact in extremities.  Symmetrical.  Speech is fluent. Motor exam as listed above. Psychiatric: Judgment intact, Mood & affect appropriate for pt's clinical situation. Dermatologic: No rashes or ulcers noted.  No cellulitis or open wounds.    Radiology No results found.  Labs No results found for this or any previous visit (from the past 2160 hours).  Assessment/Plan:  Extremity cyanosis His noninvasive studies today showed noncompressible ABIs which are falsely elevated but his waveforms are triphasic down to the ankles.  He does have diminished digital waveforms bilaterally with a digit pressure on the right 49 and on the left of 72.  I think a significant proportion of his purplish discoloration of the feet is poor venous return and likely some component of congestion from poor cardiac  function.  No limb threatening or worrisome symptoms at current.  We did discuss the importance of maintaining good footcare and avoiding wounds as these would be slow to heal with a small vessel disease.  No plan for intervention at this time.  Patient is on aspirin daily which is reasonable from my perspective.  Follow-up in 6 months with noninvasive studies.  PAF (paroxysmal atrial fibrillation) (HCC) It appears as if he was previously on  anticoagulation but not currently.  Poor cardiac function could certainly explain the appearance of his feet as well.  PAD (peripheral artery disease) (HCC) His noninvasive studies today showed noncompressible ABIs which are falsely elevated but his waveforms are triphasic down to the ankles.  He does have diminished digital waveforms bilaterally with a digit pressure on the right 49 and on the left of 72.   This would be indicative of only small vessel disease and no intervention is likely to be of any benefit.  No current limb threatening symptoms.  Plan to recheck in 6 months.      Festus Barren 11/29/2023, 4:50 PM   This note was created with Dragon medical transcription system.  Any errors from dictation are unintentional.

## 2023-11-29 NOTE — Assessment & Plan Note (Signed)
 It appears as if he was previously on anticoagulation but not currently.  Poor cardiac function could certainly explain the appearance of his feet as well.

## 2023-11-29 NOTE — Assessment & Plan Note (Signed)
 His noninvasive studies today showed noncompressible ABIs which are falsely elevated but his waveforms are triphasic down to the ankles.  He does have diminished digital waveforms bilaterally with a digit pressure on the right 49 and on the left of 72.   This would be indicative of only small vessel disease and no intervention is likely to be of any benefit.  No current limb threatening symptoms.  Plan to recheck in 6 months.

## 2023-12-02 LAB — VAS US ABI WITH/WO TBI
Left ABI: >1
Right ABI: >1

## 2024-02-21 ENCOUNTER — Encounter (INDEPENDENT_AMBULATORY_CARE_PROVIDER_SITE_OTHER): Payer: Self-pay

## 2024-03-27 ENCOUNTER — Encounter (INDEPENDENT_AMBULATORY_CARE_PROVIDER_SITE_OTHER): Payer: Self-pay | Admitting: Vascular Surgery

## 2024-06-01 ENCOUNTER — Ambulatory Visit (INDEPENDENT_AMBULATORY_CARE_PROVIDER_SITE_OTHER): Payer: Medicare PPO

## 2024-06-01 ENCOUNTER — Encounter (INDEPENDENT_AMBULATORY_CARE_PROVIDER_SITE_OTHER): Payer: Self-pay | Admitting: Vascular Surgery

## 2024-06-01 ENCOUNTER — Ambulatory Visit (INDEPENDENT_AMBULATORY_CARE_PROVIDER_SITE_OTHER): Payer: Medicare PPO | Admitting: Vascular Surgery

## 2024-06-01 VITALS — BP 129/87 | HR 78 | Ht 72.0 in | Wt 165.0 lb

## 2024-06-01 DIAGNOSIS — I480 Paroxysmal atrial fibrillation: Secondary | ICD-10-CM | POA: Diagnosis not present

## 2024-06-01 DIAGNOSIS — R230 Cyanosis: Secondary | ICD-10-CM

## 2024-06-01 DIAGNOSIS — I739 Peripheral vascular disease, unspecified: Secondary | ICD-10-CM | POA: Diagnosis not present

## 2024-06-01 NOTE — Progress Notes (Signed)
 MRN : [id number]  Gary Lane is a 89 y.o. (Oct 15, 1929) male who presents with chief complaint of  Chief Complaint  Patient presents with   Follow-up     fu 6 months + ABI Having weakness in both legs , dicuss if he need keep taking    .  History of Present Illness: Patient returns today in follow up of his PAD.  He does not have current ulceration or infection.  He has had multiple small scabs and ulcerations that have been slow to heal in the past but nothing currently.  His discoloration of the feet is better than at his last visit.  He has been keeping them warm and he says the warm weather helps as well.  His ABIs today may be somewhat elevated from medial calcification but are 1.13 on the right and 1.38 on the left.  Multiphasic waveforms are seen.  The right digit pressure is 106.  The left digit pressure is 75.  Current Outpatient Medications  Medication Sig Dispense Refill   acetaminophen (TYLENOL) 500 MG tablet Take 500 mg by mouth 2 (two) times daily as needed.     aspirin EC 81 MG tablet Take 81 mg by mouth daily.     cyanocobalamin 1000 MCG tablet Take 1,000 mcg by mouth daily.     GARLIC PO Take by mouth daily at 8 pm.     Glucosamine HCl (GLUCOSAMINE PO) Take by mouth daily at 2 am.     Magnesium Gluconate 500 (27 Mg) MG TABS Take 500 mg by mouth daily.     milk thistle 175 MG tablet Take 175 mg by mouth daily.     VITAMIN D PO Take by mouth daily at 8 pm.     metoprolol  tartrate (LOPRESSOR ) 25 MG tablet Take 0.5 tablets (12.5 mg total) by mouth 2 (two) times daily. (Patient not taking: Reported on 06/01/2024) 90 tablet 0   Rivaroxaban  (XARELTO ) 15 MG TABS tablet Take 1 tablet (15 mg total) by mouth daily with supper. (Patient not taking: Reported on 06/01/2024) 30 tablet 1   No current facility-administered medications for this visit.    Past Medical History:  Diagnosis Date   PAF (paroxysmal atrial fibrillation) (HCC)    Paroxysmal SVT (supraventricular  tachycardia) (HCC)     Past Surgical History:  Procedure Laterality Date   APPENDECTOMY     BACK SURGERY     lumbar   CHOLECYSTECTOMY     ELBOW SURGERY Right    HERNIA REPAIR       Social History   Tobacco Use   Smoking status: Never   Smokeless tobacco: Never  Vaping Use   Vaping status: Never Used  Substance Use Topics   Alcohol use: Not Currently   Drug use: Not Currently      Family History  Problem Relation Age of Onset   Stroke Mother      No Known Allergies   REVIEW OF SYSTEMS (Negative unless checked)  Constitutional: [] Weight loss  [] Fever  [] Chills Cardiac: [] Chest pain   [] Chest pressure   [x] Palpitations   [] Shortness of breath when laying flat   [] Shortness of breath at rest   [x] Shortness of breath with exertion. Vascular:  [] Pain in legs with walking   [] Pain in legs at rest   [] Pain in legs when laying flat   [] Claudication   [] Pain in feet when walking  [] Pain in feet at rest  [] Pain in feet when laying flat   []   History of DVT   [] Phlebitis   [x] Swelling in legs   [] Varicose veins   [] Non-healing ulcers Pulmonary:   [] Uses home oxygen   [] Productive cough   [] Hemoptysis   [] Wheeze  [] COPD   [] Asthma Neurologic:  [] Dizziness  [] Blackouts   [] Seizures   [] History of stroke   [] History of TIA  [] Aphasia   [] Temporary blindness   [] Dysphagia   [] Weakness or numbness in arms   [] Weakness or numbness in legs Musculoskeletal:  [] Arthritis   [] Joint swelling   [] Joint pain   [] Low back pain Hematologic:  [] Easy bruising  [] Easy bleeding   [] Hypercoagulable state   [] Anemic   Gastrointestinal:  [] Blood in stool   [] Vomiting blood  [] Gastroesophageal reflux/heartburn   [] Abdominal pain Genitourinary:  [] Chronic kidney disease   [] Difficult urination  [] Frequent urination  [] Burning with urination   [] Hematuria Skin:  [] Rashes   [] Ulcers   [] Wounds Psychological:  [] History of anxiety   []  History of major depression.  Physical Examination  BP 129/87    Pulse 78   Ht 6' (1.829 m)   Wt 165 lb (74.8 kg)   BMI 22.38 kg/m  Gen:  WD/WN, NAD. Appears younger than stated age. Head: [place]/AT, No temporalis wasting. Ear/Nose/Throat: Hearing grossly intact, nares w/o erythema or drainage Eyes: Conjunctiva clear. Sclera non-icteric Neck: Supple.  Trachea midline Pulmonary:  Good air movement, no use of accessory muscles.  Cardiac: RRR, no JVD Vascular:  Vessel Right Left  Radial Palpable Palpable                          PT 2+ Palpable 2+ Palpable  DP 1+ Palpable Trace Palpable   Gastrointestinal: soft, non-tender/non-distended. No guarding/reflex.  Musculoskeletal: M/S 5/5 throughout.  No deformity or atrophy.  In a wheelchair.  Trace lower extremity edema. Neurologic: Sensation grossly intact in extremities.  Symmetrical.  Speech is fluent.  Psychiatric: Judgment intact, Mood & affect appropriate for pt's clinical situation. Dermatologic: No rashes or ulcers noted.  No cellulitis or open wounds.      Labs No results found for this or any previous visit (from the past 2160 hours).  Radiology No results found.  Assessment/Plan  PAD (peripheral artery disease) (HCC) His ABIs today may be somewhat elevated from medial calcification but are 1.13 on the right and 1.38 on the left.  Multiphasic waveforms are seen.  The right digit pressure is 106.  The left digit pressure is 75.  No current worrisome symptoms.  The patient has stopped taking his Xarelto  but is on aspirin.  Recheck in 1 year.  PAF (paroxysmal atrial fibrillation) (HCC) Patient has apparently decided to stop taking his Xarelto .  He should discuss this with his primary care physician going forward.  Extremity cyanosis Better with the warming of the weather.  Perfusion is actually improved from his previous study and is stable.  No current limb threatening symptoms.  Follow-up in 1 year.    Selinda Gu, MD  06/04/2024 9:47 AM    This note was created with Dragon  medical transcription system.  Any errors from dictation are purely unintentional

## 2024-06-04 NOTE — Assessment & Plan Note (Signed)
 Patient has apparently decided to stop taking his Xarelto .  He should discuss this with his primary care physician going forward.

## 2024-06-04 NOTE — Assessment & Plan Note (Signed)
 Better with the warming of the weather.  Perfusion is actually improved from his previous study and is stable.  No current limb threatening symptoms.  Follow-up in 1 year.

## 2024-06-04 NOTE — Assessment & Plan Note (Signed)
 His ABIs today may be somewhat elevated from medial calcification but are 1.13 on the right and 1.38 on the left.  Multiphasic waveforms are seen.  The right digit pressure is 106.  The left digit pressure is 75.  No current worrisome symptoms.  The patient has stopped taking his Xarelto  but is on aspirin.  Recheck in 1 year.

## 2024-06-05 LAB — VAS US ABI WITH/WO TBI
Left ABI: 1.38
Right ABI: 1.13

## 2024-07-14 ENCOUNTER — Encounter: Payer: Self-pay | Admitting: Emergency Medicine

## 2024-07-14 ENCOUNTER — Ambulatory Visit
Admission: EM | Admit: 2024-07-14 | Discharge: 2024-07-14 | Disposition: A | Discharge status: Home / Self Care | Attending: Emergency Medicine | Admitting: Emergency Medicine

## 2024-07-14 DIAGNOSIS — R220 Localized swelling, mass and lump, head: Secondary | ICD-10-CM

## 2024-07-14 DIAGNOSIS — K0889 Other specified disorders of teeth and supporting structures: Secondary | ICD-10-CM | POA: Diagnosis not present

## 2024-07-14 MED ORDER — AMOXICILLIN-POT CLAVULANATE 875-125 MG PO TABS: 1.0000 | ORAL_TABLET | Freq: Two times a day (BID) | ORAL | 0 refills | Status: DC

## 2024-07-14 NOTE — ED Triage Notes (Signed)
 Patient reports right sided facial and lip swelling and right upper gum pain that started yesterday. Rates pain 6/10. Patient has been taking Tylenol with mild relief.

## 2024-07-14 NOTE — ED Provider Notes (Signed)
 Gary Lane    CSN: [id number] Arrival date & time: 07/14/24  1314      History   Chief Complaint Chief Complaint  Patient presents with   Facial Swelling   Dental Pain    HPI Gary Lane is a 89 y.o. male.   Patient arrives for evaluation of distal pain persisting for 3 months after dental work completed to a right upper tooth.  Has worsened with the right upper gum pain and right sided lip and cheek swelling beginning 1 day ago.  Has been taking Tylenol with minimal relief.  Denies drainage or fever.  Past Medical History:  Diagnosis Date   PAF (paroxysmal atrial fibrillation) (HCC)    Paroxysmal SVT (supraventricular tachycardia)     Patient Active Problem List   Diagnosis Date Noted   Extremity cyanosis 11/29/2023   PAD (peripheral artery disease) 11/29/2023   PAF (paroxysmal atrial fibrillation) (HCC)     Past Surgical History:  Procedure Laterality Date   APPENDECTOMY     BACK SURGERY     lumbar   CHOLECYSTECTOMY     ELBOW SURGERY Right    HERNIA REPAIR         Home Medications    Prior to Admission medications   Medication Sig Start Date End Date Taking? Authorizing Provider  amoxicillin-clavulanate (AUGMENTIN) 875-125 MG tablet Take 1 tablet by mouth every 12 (twelve) hours. 07/14/24  Yes Karelly Dewalt R, NP  acetaminophen (TYLENOL) 500 MG tablet Take 500 mg by mouth 2 (two) times daily as needed.    [provider]  aspirin EC 81 MG tablet Take 81 mg by mouth daily. 10/26/23   [provider]  cyanocobalamin 1000 MCG tablet Take 1,000 mcg by mouth daily.    [provider]  GARLIC PO Take by mouth daily at 8 pm.    [provider]  Glucosamine HCl (GLUCOSAMINE PO) Take by mouth daily at 2 am.    [provider]  Magnesium Gluconate 500 (27 Mg) MG TABS Take 500 mg by mouth daily.    [provider]  metoprolol  tartrate (LOPRESSOR ) 25 MG tablet Take 0.5 tablets (12.5 mg total) by  mouth 2 (two) times daily. Patient not taking: Reported on 06/01/2024 02/18/22   Darron Deatrice LABOR, MD  milk thistle 175 MG tablet Take 175 mg by mouth daily.    [provider]  Rivaroxaban  (XARELTO ) 15 MG TABS tablet Take 1 tablet (15 mg total) by mouth daily with supper. Patient not taking: Reported on 06/01/2024 02/18/22   Darron Deatrice LABOR, MD  VITAMIN D PO Take by mouth daily at 8 pm.    [provider]    Family History Family History  Problem Relation Age of Onset   Stroke Mother     Social History Social History   Tobacco Use   Smoking status: Never   Smokeless tobacco: Never  Vaping Use   Vaping status: Never Used  Substance Use Topics   Alcohol use: Not Currently   Drug use: Not Currently     Allergies   Patient has no known allergies.   Review of Systems Review of Systems   Physical Exam Triage Vital Signs ED Triage Vitals  Encounter Vitals Group     BP 07/14/24 1324 (!) 110/58     Girls Systolic BP Percentile --      Girls Diastolic BP Percentile --      Boys Systolic BP Percentile --  Boys Diastolic BP Percentile --      Pulse Rate 07/14/24 1324 80     Resp 07/14/24 1324 18     Temp 07/14/24 1324 (!) 97.5 F (36.4 C)     Temp Source 07/14/24 1324 Oral     SpO2 07/14/24 1324 100 %     Weight --      Height --      Head Circumference --      Peak Flow --      Pain Score 07/14/24 1321 6     Pain Loc --      Pain Education --      Exclude from Growth Chart --    No data found.  Updated Vital Signs BP (!) 110/58 (BP Location: Left Arm)   Pulse 80   Temp (!) 97.5 F (36.4 C) (Oral)   Resp 18   SpO2 100%   Visual Acuity Right Eye Distance:   Left Eye Distance:   Bilateral Distance:    Right Eye Near:   Left Eye Near:    Bilateral Near:     Physical Exam Constitutional:      Appearance: Normal appearance.  HENT:     Mouth/Throat:      Comments: Right cheek swelling, right upper lip swelling, tenderness above  the right upper lip between the lip and the nose, mild gingival swelling, no dental decay noted, pharynx clear without obstruction Eyes:     Extraocular Movements: Extraocular movements intact.  Pulmonary:     Effort: Pulmonary effort is normal.  Neurological:     General: No focal deficit present.     Mental Status: He is alert and oriented to person, place, and time. Mental status is at baseline.      UC Treatments / Results  Labs (all labs ordered are listed, but only abnormal results are displayed) Labs Reviewed - No data to display  EKG   Radiology No results found.  Procedures Procedures (including critical care time)  Medications Ordered in UC Medications - No data to display  Initial Impression / Assessment and Plan / UC Course  I have reviewed the triage vital signs and the nursing notes.  Pertinent labs & imaging results that were available during my care of the patient were reviewed by me and considered in my medical decision making (see chart for details).  Right facial swelling, dental pain  Vital signs stable, patient in no signs of distress nontoxic-appearing, no neurological deficits, facial swelling present on exam and experiencing dental pain most likely source of symptoms, discussed with patient and family, prescribed Augmentin, attempted to be evaluated by dentist when symptoms onset however unable, upcoming appointment in 2 days, strongly advised follow-up, given strict ER precautions for any new neurological symptoms Final Clinical Impressions(s) / UC Diagnoses   Final diagnoses:  Pain, dental  Right facial swelling     Discharge Instructions      You were evaluated for your dental pain and facial swelling which is concerning for infection  Based on exam low suspicion for stroke however if new symptoms begin such as one-sided weakness, memory or speech changes, drowsiness please go to the nearest emergency department for reevaluation  Take  Augmentin twice daily for 7 days for treatment of bacteria  May use ibuprofen 400 to 600 mg (2 to 3 tablets) every 6 hours as needed for pain and swelling  May hold on warm compresses over the affected area and 10 to 15-minute intervals  May  eat and drink as tolerable  Please schedule follow-up appointment with dentist for further evaluation of your teeth   ED Prescriptions     Medication Sig Dispense Auth. Provider   amoxicillin-clavulanate (AUGMENTIN) 875-125 MG tablet Take 1 tablet by mouth every 12 (twelve) hours. 14 tablet Yuliana Vandrunen R, NP      PDMP not reviewed this encounter.   Teresa Shelba SAUNDERS, NP 07/14/24 1346

## 2024-07-14 NOTE — Discharge Instructions (Addendum)
 You were evaluated for your dental pain and facial swelling which is concerning for infection  Based on exam low suspicion for stroke however if new symptoms begin such as one-sided weakness, memory or speech changes, drowsiness please go to the nearest emergency department for reevaluation  Take Augmentin twice daily for 7 days for treatment of bacteria  May use ibuprofen 400 to 600 mg (2 to 3 tablets) every 6 hours as needed for pain and swelling  May hold on warm compresses over the affected area and 10 to 15-minute intervals  May eat and drink as tolerable  Please schedule follow-up appointment with dentist for further evaluation of your teeth

## 2024-07-23 ENCOUNTER — Observation Stay

## 2024-07-23 ENCOUNTER — Emergency Department

## 2024-07-23 ENCOUNTER — Other Ambulatory Visit: Payer: Self-pay

## 2024-07-23 ENCOUNTER — Inpatient Hospital Stay
Admission: EM | Admit: 2024-07-23 | Discharge: 2024-07-30 | DRG: 872 | Disposition: A | Discharge status: Home Health | Attending: Internal Medicine | Admitting: Internal Medicine

## 2024-07-23 DIAGNOSIS — Z7901 Long term (current) use of anticoagulants: Secondary | ICD-10-CM

## 2024-07-23 DIAGNOSIS — E875 Hyperkalemia: Secondary | ICD-10-CM | POA: Diagnosis present

## 2024-07-23 DIAGNOSIS — R339 Retention of urine, unspecified: Secondary | ICD-10-CM | POA: Diagnosis present

## 2024-07-23 DIAGNOSIS — Z9049 Acquired absence of other specified parts of digestive tract: Secondary | ICD-10-CM

## 2024-07-23 DIAGNOSIS — R54 Age-related physical debility: Secondary | ICD-10-CM | POA: Diagnosis present

## 2024-07-23 DIAGNOSIS — I4821 Permanent atrial fibrillation: Secondary | ICD-10-CM | POA: Diagnosis present

## 2024-07-23 DIAGNOSIS — R338 Other retention of urine: Secondary | ICD-10-CM

## 2024-07-23 DIAGNOSIS — K625 Hemorrhage of anus and rectum: Secondary | ICD-10-CM | POA: Diagnosis not present

## 2024-07-23 DIAGNOSIS — K5909 Other constipation: Secondary | ICD-10-CM | POA: Diagnosis present

## 2024-07-23 DIAGNOSIS — N179 Acute kidney failure, unspecified: Principal | ICD-10-CM | POA: Diagnosis present

## 2024-07-23 DIAGNOSIS — T402X5A Adverse effect of other opioids, initial encounter: Secondary | ICD-10-CM | POA: Diagnosis present

## 2024-07-23 DIAGNOSIS — Z823 Family history of stroke: Secondary | ICD-10-CM

## 2024-07-23 DIAGNOSIS — K5900 Constipation, unspecified: Secondary | ICD-10-CM

## 2024-07-23 DIAGNOSIS — E860 Dehydration: Secondary | ICD-10-CM | POA: Diagnosis present

## 2024-07-23 DIAGNOSIS — N401 Enlarged prostate with lower urinary tract symptoms: Secondary | ICD-10-CM | POA: Diagnosis present

## 2024-07-23 DIAGNOSIS — E871 Hypo-osmolality and hyponatremia: Secondary | ICD-10-CM | POA: Diagnosis present

## 2024-07-23 DIAGNOSIS — Z7982 Long term (current) use of aspirin: Secondary | ICD-10-CM

## 2024-07-23 DIAGNOSIS — H9190 Unspecified hearing loss, unspecified ear: Secondary | ICD-10-CM | POA: Diagnosis present

## 2024-07-23 DIAGNOSIS — A419 Sepsis, unspecified organism: Principal | ICD-10-CM | POA: Diagnosis present

## 2024-07-23 DIAGNOSIS — D61818 Other pancytopenia: Secondary | ICD-10-CM | POA: Clinically undetermined

## 2024-07-23 DIAGNOSIS — Z66 Do not resuscitate: Secondary | ICD-10-CM | POA: Diagnosis not present

## 2024-07-23 DIAGNOSIS — K5903 Drug induced constipation: Secondary | ICD-10-CM | POA: Diagnosis present

## 2024-07-23 LAB — CBC
HCT: 36.7 % — ABNORMAL LOW (ref 39.0–52.0)
Hemoglobin: 12.3 g/dL — ABNORMAL LOW (ref 13.0–17.0)
MCH: 38.4 pg — ABNORMAL HIGH (ref 26.0–34.0)
MCHC: 33.5 g/dL (ref 30.0–36.0)
MCV: 114.7 fL — ABNORMAL HIGH (ref 80.0–100.0)
Platelets: 206 K/uL (ref 150–400)
RBC: 3.2 MIL/uL — ABNORMAL LOW (ref 4.22–5.81)
RDW: 14.9 % (ref 11.5–15.5)
WBC: 8.5 K/uL (ref 4.0–10.5)
nRBC: 0 % (ref 0.0–0.2)

## 2024-07-23 LAB — URINALYSIS, ROUTINE W REFLEX MICROSCOPIC
Bilirubin Urine: NEGATIVE
Glucose, UA: NEGATIVE mg/dL
Ketones, ur: NEGATIVE mg/dL
Leukocytes,Ua: NEGATIVE
Nitrite: NEGATIVE
Protein, ur: 30 mg/dL — AB
RBC / HPF: 0 RBC/hpf (ref 0–5)
Specific Gravity, Urine: 1.021 (ref 1.005–1.030)
pH: 5 (ref 5.0–8.0)

## 2024-07-23 LAB — COMPREHENSIVE METABOLIC PANEL WITH GFR
ALT: 28 U/L (ref 0–44)
AST: 112 U/L — ABNORMAL HIGH (ref 15–41)
Albumin: 3.7 g/dL (ref 3.5–5.0)
Alkaline Phosphatase: 90 U/L (ref 38–126)
Anion gap: 13 (ref 5–15)
BUN: 43 mg/dL — ABNORMAL HIGH (ref 8–23)
CO2: 21 mmol/L — ABNORMAL LOW (ref 22–32)
Calcium: 9.2 mg/dL (ref 8.9–10.3)
Chloride: 100 mmol/L (ref 98–111)
Creatinine, Ser: 1.92 mg/dL — ABNORMAL HIGH (ref 0.61–1.24)
GFR, Estimated: 32 mL/min — ABNORMAL LOW (ref >60)
Glucose, Bld: 97 mg/dL (ref 70–99)
Potassium: 5.6 mmol/L — ABNORMAL HIGH (ref 3.5–5.1)
Sodium: 134 mmol/L — ABNORMAL LOW (ref 135–145)
Total Bilirubin: 1.1 mg/dL (ref 0.0–1.2)
Total Protein: 8.2 g/dL — ABNORMAL HIGH (ref 6.5–8.1)

## 2024-07-23 LAB — LIPASE, BLOOD: Lipase: 59 U/L — ABNORMAL HIGH (ref 11–51)

## 2024-07-23 LAB — TYPE AND SCREEN
ABO/RH(D): O POS
Antibody Screen: NEGATIVE

## 2024-07-23 MED ORDER — SODIUM CHLORIDE 0.9 % IV BOLUS
1000.0000 mL | Freq: Once | INTRAVENOUS | Status: AC
  Administered 2024-07-23: 1000 mL via INTRAVENOUS

## 2024-07-23 MED ORDER — VITAMIN B-12 1000 MCG PO TABS
1000.0000 ug | ORAL_TABLET | Freq: Every day | ORAL | Status: DC
  Administered 2024-07-24 – 2024-07-30 (×7): 1000 ug via ORAL
  Filled 2024-07-23 (×7): qty 1

## 2024-07-23 MED ORDER — TAMSULOSIN HCL 0.4 MG PO CAPS
0.4000 mg | ORAL_CAPSULE | Freq: Every day | ORAL | Status: DC
  Administered 2024-07-24 – 2024-07-29 (×6): 0.4 mg via ORAL
  Filled 2024-07-23 (×6): qty 1

## 2024-07-23 MED ORDER — ENOXAPARIN SODIUM 30 MG/0.3ML IJ SOSY
30.0000 mg | PREFILLED_SYRINGE | INTRAMUSCULAR | Status: DC
  Administered 2024-07-24: 30 mg via SUBCUTANEOUS
  Filled 2024-07-23: qty 0.3

## 2024-07-23 MED ORDER — MAGNESIUM GLUCONATE 500 (27 MG) MG PO TABS
500.0000 mg | ORAL_TABLET | Freq: Every day | ORAL | Status: DC
  Administered 2024-07-24 – 2024-07-30 (×7): 500 mg via ORAL
  Filled 2024-07-23 (×7): qty 1

## 2024-07-23 MED ORDER — ONDANSETRON HCL 4 MG/2ML IJ SOLN: 4.0000 mg | Freq: Four times a day (QID) | INTRAMUSCULAR | Status: DC | PRN

## 2024-07-23 MED ORDER — ACETAMINOPHEN 325 MG PO TABS
650.0000 mg | ORAL_TABLET | Freq: Four times a day (QID) | ORAL | Status: DC | PRN
Start: 2024-07-23 — End: 2024-07-30
  Administered 2024-07-23 – 2024-07-26 (×5): 650 mg via ORAL
  Filled 2024-07-23 (×5): qty 2

## 2024-07-23 MED ORDER — SODIUM CHLORIDE 0.9% FLUSH
3.0000 mL | Freq: Two times a day (BID) | INTRAVENOUS | Status: DC
  Administered 2024-07-23 – 2024-07-30 (×9): 3 mL via INTRAVENOUS

## 2024-07-23 MED ORDER — MINERAL OIL RE ENEM
1.0000 | ENEMA | Freq: Once | RECTAL | Status: AC
  Administered 2024-07-23: 1 via RECTAL

## 2024-07-23 MED ORDER — LACTATED RINGERS IV SOLN: INTRAVENOUS | Status: AC

## 2024-07-23 MED ORDER — LACTULOSE 10 GM/15ML PO SOLN
20.0000 g | Freq: Once | ORAL | Status: DC
  Filled 2024-07-23: qty 30

## 2024-07-23 MED ORDER — ACETAMINOPHEN 500 MG PO TABS
1000.0000 mg | ORAL_TABLET | Freq: Once | ORAL | Status: AC
  Administered 2024-07-23: 1000 mg via ORAL
  Filled 2024-07-23: qty 2

## 2024-07-23 MED ORDER — ONDANSETRON HCL 4 MG PO TABS: 4.0000 mg | ORAL_TABLET | Freq: Four times a day (QID) | ORAL | Status: DC | PRN

## 2024-07-23 MED ORDER — ACETAMINOPHEN 650 MG RE SUPP
650.0000 mg | Freq: Four times a day (QID) | RECTAL | Status: DC | PRN
  Administered 2024-07-26: 650 mg via RECTAL
  Filled 2024-07-23: qty 1

## 2024-07-23 MED ORDER — SODIUM ZIRCONIUM CYCLOSILICATE 10 G PO PACK
10.0000 g | PACK | Freq: Once | ORAL | Status: AC
  Administered 2024-07-23: 10 g via ORAL
  Filled 2024-07-23 (×2): qty 1

## 2024-07-23 MED ORDER — LACTULOSE 10 GM/15ML PO SOLN
30.0000 g | Freq: Two times a day (BID) | ORAL | Status: DC
  Administered 2024-07-23 – 2024-07-27 (×6): 30 g via ORAL
  Filled 2024-07-23 (×7): qty 60

## 2024-07-23 NOTE — ED Notes (Signed)
 Patient complaining of abdominal pain, per doc there aren't many pain meds that can be given due to worsening of symptoms. Patient aware.

## 2024-07-23 NOTE — H&P (Signed)
 History and Physical    Patient: Gary Lane FMW:[id number] DOB: 12-15-1929 DOA: 07/23/2024 DOS: the patient was seen and examined on 07/23/2024 PCP: Alla Amis, MD  Patient coming from: Home  Chief Complaint: Bleeding per rectum  HPI: Gary Lane is a 89 y.o. male with medical history significant of paroxysmal atrial fibrillation not on any anticoagulation, prior cholecystectomy and a prostate procedure, came to ED with concern of having small amount of bleeding per rectum when he was trying to strain due to constipation.  Per patient he had a fall when he slipped out of bed and unable to get up himself 2 weeks ago, no reported injuries as imaging was obtained by PCP.  Due to aches and pains he was given opioid by PCP.  Patient also has a recent dental infection for which he just completed a course of Augmentin.  Patient developed constipation, poor p.o. intake, he started taking some laxative but only had small amount of watery bowel movement, due to excessive straining he saw some bleeding per rectum which prompted him to come to ED.  Patient denies any abdominal pain.  Was not urinating well since yesterday and also need to strain with urination for 1 day. Denies any dysuria or hematuria.  Patient denies any other recent illnesses, no upper respiratory symptoms.  No chest pain or shortness of breath.  No fever or chills.  ED course and data reviewed.  On arrival hemodynamically stable, CBC seems stable, does have significant macrocytosis, labs with mild hyponatremia with sodium at 134, hyperkalemia with potassium of 5.6, CO2 21, BUN 43, creatinine 1.92 with baseline around 1.1, AST 112, lipase 56. EKG with atrial fibrillation.  CT head and cervical spine was negative for any acute abnormality, did show age-related cerebral atrophy, ventriculomegaly and periventricular white matter disease.  CT abdomen and pelvis with no acute abnormality.  Post cholecystectomy  changes.  Distended bladder.  Patient was given 1 L of bolus, Lokelma and enema ordered.  He  Review of Systems: As mentioned in the history of present illness. All other systems reviewed and are negative. Past Medical History:  Diagnosis Date   PAF (paroxysmal atrial fibrillation) (HCC)    Paroxysmal SVT (supraventricular tachycardia)    Past Surgical History:  Procedure Laterality Date   APPENDECTOMY     BACK SURGERY     lumbar   CHOLECYSTECTOMY     ELBOW SURGERY Right    HERNIA REPAIR     Social History:  reports that he has never smoked. He has never used smokeless tobacco. He reports that he does not currently use alcohol. He reports that he does not currently use drugs.  No Known Allergies  Family History  Problem Relation Age of Onset   Stroke Mother     Prior to Admission medications   Medication Sig Start Date End Date Taking? Authorizing Provider  acetaminophen (TYLENOL) 500 MG tablet Take 500 mg by mouth 2 (two) times daily as needed.    [provider]  amoxicillin-clavulanate (AUGMENTIN) 875-125 MG tablet Take 1 tablet by mouth every 12 (twelve) hours. 07/14/24   Teresa Shelba SAUNDERS, NP  aspirin EC 81 MG tablet Take 81 mg by mouth daily. 10/26/23   [provider]  cyanocobalamin 1000 MCG tablet Take 1,000 mcg by mouth daily.    [provider]  GARLIC PO Take by mouth daily at 8 pm.    [provider]  Glucosamine HCl (GLUCOSAMINE PO) Take by mouth daily at 2  am.    [provider]  Magnesium Gluconate 500 (27 Mg) MG TABS Take 500 mg by mouth daily.    [provider]  metoprolol  tartrate (LOPRESSOR ) 25 MG tablet Take 0.5 tablets (12.5 mg total) by mouth 2 (two) times daily. Patient not taking: Reported on 06/01/2024 02/18/22   Darron Deatrice LABOR, MD  milk thistle 175 MG tablet Take 175 mg by mouth daily.    [provider]  Rivaroxaban  (XARELTO ) 15 MG TABS tablet Take 1 tablet (15 mg total) by mouth daily  with supper. Patient not taking: Reported on 06/01/2024 02/18/22   Darron Deatrice LABOR, MD  VITAMIN D PO Take by mouth daily at 8 pm.    [provider]    Physical Exam: Vitals:   07/23/24 1330 07/23/24 1430 07/23/24 1500 07/23/24 1530  BP: (!) 126/90 (!) 132/90 109/79 96/63  Pulse: (!) 104 (!) 113 95 99  Resp: 20 20 20 20   Temp:      SpO2: 100% 96% 99% 100%  Weight:      Height:       Vitals:   07/23/24 1330 07/23/24 1430 07/23/24 1500 07/23/24 1530  BP: (!) 126/90 (!) 132/90 109/79 96/63  Pulse: (!) 104 (!) 113 95 99  Resp: 20 20 20 20   Temp:      SpO2: 100% 96% 99% 100%  Weight:      Height:       General: Vital signs reviewed.  Frail elderly man,, in no acute distress and cooperative with exam.  Head: Normocephalic and atraumatic. Eyes: EOMI, conjunctivae normal, no scleral icterus.  Neck: Supple, trachea midline, normal ROM,  Cardiovascular: Irregular Pulmonary/Chest: Clear to auscultation bilaterally, no wheezes, rales, or rhonchi. Abdominal: Soft, non-tender, non-distended, BS +, Extremities: No lower extremity edema bilaterally,  pulses symmetric and intact bilaterally. No cyanosis or clubbing. Neurological: A&O x3, Strength is normal and symmetric bilaterally, cranial nerve II-XII are grossly intact, no focal motor deficit, sensory intact to light touch bilaterally.  Psychiatric: Normal mood and affect.   Data Reviewed: Prior data reviewed as mentioned above  Assessment and Plan: * AKI (acute kidney injury) Likely secondary to some dehydration and urinary retention.  Creatinine at 1.92 with baseline around 1.1.  Patient received 1 L of bolus in ED. - Admit to MedSurg -Ordered renal ultrasound -IV fluid for 1 day -Monitor renal function -Avoid nephrotoxins  Acute urinary retention Patient has an history of prostate procedure but does not take any Flomax at home. Recent use of pain medication and constipation can be contributory. Postvoid bladder  volume more than 300 mL - In-N-Out catheter once -If patient unable to urinate after getting enema for constipation and having BM then might need a Foley catheter -Continue to monitor-  Hyperkalemia Mild hyperkalemia with potassium of 5.6 likely secondary to AKI. - Patient was given 1 dose of Lokelma -Giving some IV fluid -Monitor potassium  Rectal bleeding Had a small amount of bleeding with straining and constipation. Rectal exam done by EDP was negative for any bleeding. - Monitor CBC -We will involve GI if there is a recurrence  Constipation Likely secondary to opioid use. Per patient he was quite regular before that. - Bowel regimen  Permanent atrial fibrillation (HCC) Rate controlled.  Not on any medication or anticoagulation at home. Per patient he was taking Eliquis  long time ago and was stopped taking it for about a year after discussing with cardiologist as he does not want to take anymore blood  thinners.    Advance Care Planning:   Code Status: Full Code discussed with patient and son  Consults: None  Family Communication: Discussed with son at bedside  Severity of Illness: The appropriate patient status for this patient is OBSERVATION. Observation status is judged to be reasonable and necessary in order to provide the required intensity of service to ensure the patient's safety. The patient's presenting symptoms, physical exam findings, and initial radiographic and laboratory data in the context of their medical condition is felt to place them at decreased risk for further clinical deterioration. Furthermore, it is anticipated that the patient will be medically stable for discharge from the hospital within 2 midnights of admission.   This record has been created using Conservation officer, historic buildings. Errors have been sought and corrected,but may not always be located. Such creation errors do not reflect on the standard of care.   Author: Amaryllis Dare,  MD 07/23/2024 4:15 PM  For on call review [email or web address].

## 2024-07-23 NOTE — Assessment & Plan Note (Addendum)
 Patient has an history of prostate procedure but does not take any Flomax at home. Recent use of pain medication and constipation can be contributory. Postvoid bladder volume more than 300 mL - In-N-Out catheter once -If patient unable to urinate after getting enema for constipation and having BM then might need a Foley catheter -Continue to monitor-

## 2024-07-23 NOTE — Assessment & Plan Note (Signed)
 Likely secondary to opioid use. Per patient he was quite regular before that. - Bowel regimen

## 2024-07-23 NOTE — ED Notes (Signed)
 Patient was placed on bedpan after enema insertion. Patient was unable to have bowel movement.

## 2024-07-23 NOTE — Assessment & Plan Note (Addendum)
 Likely secondary to some dehydration and urinary retention.  Creatinine at 1.92 with baseline around 1.1.  Patient received 1 L of bolus in ED. - Admit to MedSurg -Ordered renal ultrasound -IV fluid for 1 day -Monitor renal function -Avoid nephrotoxins

## 2024-07-23 NOTE — ED Notes (Signed)
 Still awaiting enema from pharmacy

## 2024-07-23 NOTE — Progress Notes (Signed)
 PHARMACIST - PHYSICIAN COMMUNICATION  CONCERNING:  Enoxaparin (Lovenox) for DVT Prophylaxis    RECOMMENDATION: Patient was prescribed enoxaprin 40mg  q24 hours for VTE prophylaxis.   Filed Weights   07/23/24 1159  Weight: 74.8 kg (164 lb 14.5 oz)    Body mass index is 22.37 kg/m.  Estimated Creatinine Clearance: 26 mL/min (A) (by C-G formula based on SCr of 1.92 mg/dL (H)).  Patient is candidate for enoxaparin 30mg  every 24 hours based on CrCl <73ml/min or Weight <45kg  DESCRIPTION: Pharmacy has adjusted enoxaparin dose per [facility] policy.  Patient is now receiving enoxaparin 30 mg every 24 hours    Damien Napoleon, PharmD Clinical Pharmacist  07/23/2024 4:01 PM

## 2024-07-23 NOTE — Assessment & Plan Note (Signed)
 Had a small amount of bleeding with straining and constipation. Rectal exam done by EDP was negative for any bleeding. - Monitor CBC -We will involve GI if there is a recurrence

## 2024-07-23 NOTE — ED Notes (Signed)
 Awaiting enema to come from pharmacy.

## 2024-07-23 NOTE — Assessment & Plan Note (Signed)
 Rate controlled.  Not on any medication or anticoagulation at home. Per patient he was taking Eliquis  long time ago and was stopped taking it for about a year after discussing with cardiologist as he does not want to take anymore blood thinners.

## 2024-07-23 NOTE — ED Provider Notes (Addendum)
 [facility] Provider Note    Event Date/Time   First MD Initiated Contact with Patient 07/23/24 1243     (approximate)   History   No chief complaint on file.   HPI  Gary Lane is a 89 y.o. male who has been on pain medication antibiotics who comes in with concerns for rectal bleeding.  Patient reportedly took a stool softener yesterday morning having loose stools since then.  They now noticed some dark red blood in the stool.  He reports discomfort in his lower abdomen as well as discomfort in his rectum.  Physical Exam   Triage Vital Signs: ED Triage Vitals  Encounter Vitals Group     BP 07/23/24 1201 120/75     Girls Systolic BP Percentile --      Girls Diastolic BP Percentile --      Boys Systolic BP Percentile --      Boys Diastolic BP Percentile --      Pulse Rate 07/23/24 1201 (!) 50     Resp 07/23/24 1201 17     Temp 07/23/24 1201 98.5 F (36.9 C)     Temp src --      SpO2 07/23/24 1201 98 %     Weight 07/23/24 1159 164 lb 14.5 oz (74.8 kg)     Height 07/23/24 1159 6' (1.829 m)     Head Circumference --      Peak Flow --      Pain Score 07/23/24 1156 8     Pain Loc --      Pain Education --      Exclude from Growth Chart --     Most recent vital signs: Vitals:   07/23/24 1201  BP: 120/75  Pulse: (!) 50  Resp: 17  Temp: 98.5 F (36.9 C)  SpO2: 98%     General: Awake, no distress.  CV:  Good peripheral perfusion.  Resp:  Normal effort.  Abd:  No distention.  Tenderness in the lower abdomen. Other:  Stool noted in the fossa with brown in nature minimal blood noted   ED Results / Procedures / Treatments   Labs (all labs ordered are listed, but only abnormal results are displayed) Labs Reviewed  LIPASE, BLOOD - Abnormal; Notable for the following components:      Result Value   Lipase 59 (*)    All other components within normal limits  COMPREHENSIVE METABOLIC PANEL WITH GFR - Abnormal; Notable for the  following components:   Sodium 134 (*)    Potassium 5.6 (*)    CO2 21 (*)    BUN 43 (*)    Creatinine, Ser 1.92 (*)    Total Protein 8.2 (*)    AST 112 (*)    GFR, Estimated 32 (*)    All other components within normal limits  CBC - Abnormal; Notable for the following components:   RBC 3.20 (*)    Hemoglobin 12.3 (*)    HCT 36.7 (*)    MCV 114.7 (*)    MCH 38.4 (*)    All other components within normal limits  URINALYSIS, ROUTINE W REFLEX MICROSCOPIC  TYPE AND SCREEN     EKG  My interpretation of EKG:  Atrial fibrillation with a rate of 108 without any ST elevation, occasional PVCs  RADIOLOGY I have reviewed the ct personally and interpreted no intercranial hemorrhage   PROCEDURES:  Critical Care performed: No  .1-3 Lead EKG Interpretation  Performed by:  Ernest Ronal BRAVO, MD Authorized by: Ernest Ronal BRAVO, MD     Interpretation: abnormal     ECG rate:  100   ECG rate assessment: tachycardic     Rhythm: atrial fibrillation     Ectopy: PVCs     Conduction: normal   .Critical Care  Performed by: Ernest Ronal BRAVO, MD Authorized by: Ernest Ronal BRAVO, MD   Critical care provider statement:    Critical care time (minutes):  30   Critical care was necessary to treat or prevent imminent or life-threatening deterioration of the following conditions: hyperkalemia.   Critical care was time spent personally by me on the following activities:  Development of treatment plan with patient or surrogate, discussions with consultants, evaluation of patient's response to treatment, examination of patient, ordering and review of laboratory studies, ordering and review of radiographic studies, ordering and performing treatments and interventions, pulse oximetry, re-evaluation of patient's condition and review of old charts    MEDICATIONS ORDERED IN ED: Medications  sodium zirconium cyclosilicate (LOKELMA) packet 10 g (has no administration in time range)  sodium chloride  0.9 % bolus 1,000  mL (1,000 mLs Intravenous New Bag/Given 07/23/24 1334)  acetaminophen (TYLENOL) tablet 1,000 mg (1,000 mg Oral Given 07/23/24 1336)     IMPRESSION / MDM / ASSESSMENT AND PLAN / ED COURSE  I reviewed the triage vital signs and the nursing notes.   Patient's presentation is most consistent with acute presentation with potential threat to life or bodily function.   Differential is lower GI bleed secondary to hemorrhoids, diverticulosis.  Rectal exam does show a good amount of stool in the vault but given significant discomfort unable to do a disimpaction.  He did have a large bloody bowel movement but I did my rectal exam the stool was brown and there was not any blood noted on rectal exam other than the blood that was on his diaper.  This makes me think this is most likely an internal hemorrhoid that is intermittent bleeding secondary to the constipation.  Lipase slightly elevated.  CMP does show elevated creatinine of 1.9 with a potassium of 5.6.  CBC shows hemoglobin about 12.3  Reviewed patient's blood work from 08/19/2023 where patient had creatinine of 1.1  I considered doing a CT with contrast but my suspicion for active GI bleed was very low at this time and I suspect this is more likely related to the constipation and the hemorrhoid.  Do not want to potentially worsen his kidneys more with using contrast.  Patient still with some tachycardia after fluids and CT scan does show severe constipation with stool ball but I was unable to do bedside disimpaction due to patient's discomfort.  Given his rectal bleeding, tachycardia, AKI and constipation I do think patient would benefit from hospital admission for monitoring of his electrolytes, kidney function while trying to help with his constipation.  They discussed this with family and they felt more comfortable with this plan and will discuss with hospital team for admission.  The CT reads are still pending and so these will need to be followed  up with the hospitalist for the official reports.    The patient is on the cardiac monitor to evaluate for evidence of arrhythmia and/or significant heart rate changes.      FINAL CLINICAL IMPRESSION(S) / ED DIAGNOSES   Final diagnoses:  AKI (acute kidney injury)  Other constipation  Rectal bleeding  Hyperkalemia     Rx / DC Orders   ED  Discharge Orders     None        Note:  This document was prepared using Dragon voice recognition software and may include unintentional dictation errors.   Ernest Ronal BRAVO, MD 07/23/24 1452    Ernest Ronal BRAVO, MD 07/23/24 [phone]

## 2024-07-23 NOTE — ED Triage Notes (Signed)
 Pt comes in via pov with complaints of rectal bleeding that started this morning. Pt's family states that he has been on antibiotics and pain medicine, and has been constipated the past few days. PT  took a stool softener yesterday morning  and has been having loose stools since then, and has now noticed dark red blood in his stool. Pt states that he in not sure if the blood is from straining yesterday, and is unable to control his bowels at this time. Pt complains of pain in his lower abdomen and rectum 8/10.

## 2024-07-23 NOTE — Assessment & Plan Note (Addendum)
 Mild hyperkalemia with potassium of 5.6 likely secondary to AKI. - Patient was given 1 dose of Lokelma -Giving some IV fluid -Monitor potassium

## 2024-07-24 ENCOUNTER — Observation Stay

## 2024-07-24 DIAGNOSIS — N179 Acute kidney failure, unspecified: Secondary | ICD-10-CM | POA: Diagnosis not present

## 2024-07-24 DIAGNOSIS — K625 Hemorrhage of anus and rectum: Secondary | ICD-10-CM | POA: Diagnosis not present

## 2024-07-24 DIAGNOSIS — K5909 Other constipation: Secondary | ICD-10-CM | POA: Diagnosis not present

## 2024-07-24 DIAGNOSIS — E875 Hyperkalemia: Secondary | ICD-10-CM | POA: Diagnosis not present

## 2024-07-24 LAB — CBC
HCT: 30.9 % — ABNORMAL LOW (ref 39.0–52.0)
Hemoglobin: 10.3 g/dL — ABNORMAL LOW (ref 13.0–17.0)
MCH: 38.7 pg — ABNORMAL HIGH (ref 26.0–34.0)
MCHC: 33.3 g/dL (ref 30.0–36.0)
MCV: 116.2 fL — ABNORMAL HIGH (ref 80.0–100.0)
Platelets: 142 K/uL — ABNORMAL LOW (ref 150–400)
RBC: 2.66 MIL/uL — ABNORMAL LOW (ref 4.22–5.81)
RDW: 14.8 % (ref 11.5–15.5)
WBC: 7.6 K/uL (ref 4.0–10.5)
nRBC: 0 % (ref 0.0–0.2)

## 2024-07-24 LAB — FERRITIN: Ferritin: 210 ng/mL (ref 24–336)

## 2024-07-24 LAB — VITAMIN B12: Vitamin B-12: >4000 pg/mL — ABNORMAL HIGH (ref 180–914)

## 2024-07-24 LAB — RETICULOCYTES
Immature Retic Fract: 18.5 % — ABNORMAL HIGH (ref 2.3–15.9)
RBC.: 2.63 MIL/uL — ABNORMAL LOW (ref 4.22–5.81)
Retic Count, Absolute: 27.9 K/uL (ref 19.0–186.0)
Retic Ct Pct: 1.1 % (ref 0.4–3.1)

## 2024-07-24 LAB — FOLATE: Folate: >20 ng/mL (ref >5.9)

## 2024-07-24 LAB — BASIC METABOLIC PANEL WITH GFR
Anion gap: 7 (ref 5–15)
BUN: 35 mg/dL — ABNORMAL HIGH (ref 8–23)
CO2: 22 mmol/L (ref 22–32)
Calcium: 8.3 mg/dL — ABNORMAL LOW (ref 8.9–10.3)
Chloride: 107 mmol/L (ref 98–111)
Creatinine, Ser: 1.51 mg/dL — ABNORMAL HIGH (ref 0.61–1.24)
GFR, Estimated: 43 mL/min — ABNORMAL LOW (ref >60)
Glucose, Bld: 88 mg/dL (ref 70–99)
Potassium: 4.4 mmol/L (ref 3.5–5.1)
Sodium: 136 mmol/L (ref 135–145)

## 2024-07-24 LAB — IRON AND TIBC
Iron: 33 ug/dL — ABNORMAL LOW (ref 45–182)
Saturation Ratios: 12 % — ABNORMAL LOW (ref 17.9–39.5)
TIBC: 266 ug/dL (ref 250–450)
UIBC: 233 ug/dL

## 2024-07-24 MED ORDER — FERROUS SULFATE 325 (65 FE) MG PO TABS: 325.0000 mg | ORAL_TABLET | Freq: Every day | ORAL | 0 refills | Status: DC

## 2024-07-24 MED ORDER — LACTATED RINGERS IV SOLN: INTRAVENOUS | Status: AC

## 2024-07-24 MED ORDER — LACTULOSE 10 GM/15ML PO SOLN: 30.0000 g | Freq: Two times a day (BID) | ORAL | 0 refills | Status: DC

## 2024-07-24 MED ORDER — FERROUS SULFATE 325 (65 FE) MG PO TABS
325.0000 mg | ORAL_TABLET | Freq: Every day | ORAL | Status: DC
  Administered 2024-07-25 – 2024-07-30 (×6): 325 mg via ORAL
  Filled 2024-07-24 (×6): qty 1

## 2024-07-24 MED ORDER — TAMSULOSIN HCL 0.4 MG PO CAPS: 0.4000 mg | ORAL_CAPSULE | Freq: Every day | ORAL | 2 refills | Status: DC

## 2024-07-24 NOTE — Plan of Care (Signed)

## 2024-07-24 NOTE — Discharge Summary (Signed)
 Physician Discharge Summary   Patient: Gary Lane MRN: [id number] DOB: 18-Feb-1930  Admit date:     07/23/2024  Discharge date: 07/24/24  Discharge Physician: Amaryllis Dare   PCP: Alla Amis, MD   Recommendations at discharge:  Please obtain CBC and BMP on follow-up Please avoid opioid containing pain medications to prevent constipation. Follow-up with urology-patient was having significant postvoid bladder volume, starting on Flomax Follow-up with primary care provider  Discharge Diagnoses: Principal Problem:   AKI (acute kidney injury) Active Problems:   Acute urinary retention   Hyperkalemia   Rectal bleeding   Constipation   Permanent atrial fibrillation [facility])   Hospital Course: CHRISTAPHER GILLIAN is a 89 y.o. male with medical history significant of paroxysmal atrial fibrillation not on any anticoagulation, prior cholecystectomy and a prostate procedure, came to ED with concern of having small amount of bleeding per rectum when he was trying to strain due to constipation.   Per patient he had a fall when he slipped out of bed and unable to get up himself 2 weeks ago, no reported injuries as imaging was obtained by PCP.  Due to aches and pains he was given opioid by PCP.  Patient also has a recent dental infection for which he just completed a course of Augmentin.   Patient developed constipation, poor p.o. intake, he started taking some laxative but only had small amount of watery bowel movement, due to excessive straining he saw some bleeding per rectum which prompted him to come to ED.  On arrival hemodynamically stable, CBC seems stable, does have significant macrocytosis, labs with mild hyponatremia with sodium at 134, hyperkalemia with potassium of 5.6, CO2 21, BUN 43, creatinine 1.92 with baseline around 1.1, AST 112, lipase 56. EKG with atrial fibrillation.   CT head and cervical spine was negative for any acute abnormality, did show age-related cerebral  atrophy, ventriculomegaly and periventricular white matter disease.   CT abdomen and pelvis with no acute abnormality.  Post cholecystectomy changes.  Distended bladder.   Patient was given 1 L of bolus, Lokelma and enema ordered.   10/21: Vital stable, hemoglobin with some decreased to 10.3, hyperkalemia resolved, creatinine improving now at 1.51.  No further bleeding reported and patient has a good bowel movement.  Patient continued to have significant postvoid volume.  Renal ultrasound with concern of medical renal disease and no hydronephrosis noted.  Patient was started on Flomax and need to have an outpatient follow-up with urology.  Anemia panel with anemia of chronic disease and mild iron deficiency, started on iron supplement.  Patient was also given bowel regimen and he should avoid opioids to prevent further constipation.  Patient was instructed to keep himself well hydrated and need to have a close follow-up with his primary care provider and urology for further assistance.  Assessment and Plan: * AKI (acute kidney injury) Likely secondary to some dehydration and urinary retention.  Creatinine at 1.92 with baseline around 1.1.  Patient received 1 L of bolus in ED. and started on IV fluid on admission Creatinine improved to 1.5, renal ultrasound with medical renal disease and no hydronephrosis.  Acute urinary retention Patient has an history of prostate procedure but does not take any Flomax at home. Recent use of pain medication and constipation can be contributory. Not much urinary complaints but still having significant postvoid urinary volume - Start him on Flomax - Outpatient urology evaluation  Hyperkalemia Mild hyperkalemia with potassium of 5.6 likely secondary to AKI. Resolved  Rectal bleeding Had a small amount of bleeding with straining and constipation. Rectal exam done by EDP was negative for any further bleeding. - Monitor CBC  Constipation Likely  secondary to opioid use. Per patient he was quite regular before that. - Bowel regimen  Permanent atrial fibrillation (HCC) Rate controlled.  Not on any medication or anticoagulation at home. Per patient he was taking Eliquis  long time ago and was stopped taking it for about a year after discussing with cardiologist as he does not want to take anymore blood thinners.   Consultants: None Procedures performed: None Disposition: Home health Diet recommendation:  Discharge Diet Orders (From admission, onward)     Start     Ordered   07/24/24 0000  Diet - low sodium heart healthy        07/24/24 1531           Regular diet DISCHARGE MEDICATION: Allergies as of 07/24/2024   No Known Allergies      Medication List     STOP taking these medications    metoprolol  tartrate 25 MG tablet Commonly known as: LOPRESSOR        TAKE these medications    acetaminophen 500 MG tablet Commonly known as: TYLENOL Take 500 mg by mouth 2 (two) times daily as needed.   aspirin EC 81 MG tablet Take 81 mg by mouth daily.   cyanocobalamin 1000 MCG tablet Take 1,000 mcg by mouth daily.   ferrous sulfate 325 (65 FE) MG tablet Take 1 tablet (325 mg total) by mouth daily with breakfast. Start taking on: July 25, 2024   GARLIC PO Take by mouth daily at 8 pm.   GLUCOSAMINE PO Take by mouth daily at 2 am.   lactulose 10 GM/15ML solution Commonly known as: CHRONULAC Take 45 mLs (30 g total) by mouth 2 (two) times daily.   magnesium gluconate 500 (27 Mg) MG Tabs tablet Commonly known as: MAGONATE Take 500 mg by mouth daily.   milk thistle 175 MG tablet Take 175 mg by mouth daily.   Rivaroxaban  15 MG Tabs tablet Commonly known as: Xarelto  Take 1 tablet (15 mg total) by mouth daily with supper.   tamsulosin 0.4 MG Caps capsule Commonly known as: FLOMAX Take 1 capsule (0.4 mg total) by mouth daily after supper.   traMADol 50 MG tablet Commonly known as: ULTRAM Take 50 mg  by mouth every 6 (six) hours as needed for moderate pain (pain score 4-6).   VITAMIN D PO Take by mouth daily at 8 pm.        Follow-up Information     Alla Amis, MD. Schedule an appointment as soon as possible for a visit in 1 week(s).   Specialty: Family Medicine Contact information: [address] [facility] [place] [address] [phone]                Discharge Exam: Fredricka Weights   07/23/24 1159  Weight: 74.8 kg   General.  Frail and hard of hearing elderly man, in no acute distress. Pulmonary.  Lungs clear bilaterally, normal respiratory effort. CV.  Regular rate and rhythm, no JVD, rub or murmur. Abdomen.  Soft, nontender, nondistended, BS positive. CNS.  Alert and oriented .  No focal neurologic deficit. Extremities.  No edema, no cyanosis, pulses intact and symmetrical.   Condition at discharge: stable  The results of significant diagnostics from this hospitalization (including imaging, microbiology, ancillary and laboratory) are listed below for reference.   Imaging  Studies: US  RENAL Result Date: 07/24/2024 CLINICAL DATA:  Acute kidney injury EXAM: RENAL / URINARY TRACT ULTRASOUND COMPLETE COMPARISON:  CT abdomen pelvis 07/23/2024 FINDINGS: Right Kidney: Renal measurements: 9.3 x 4.8 x 4.9 cm = volume: 114 mL. Mild increased echogenicity of the renal cortex without significant thinning. No mass or hydronephrosis visualized. Left Kidney: Renal measurements: 10.4 x 5.6 x 5.6 cm = volume: 178 mL. Mild increased echogenicity of the renal cortex without significant thinning. No mass or hydronephrosis visualized. Bladder: Appears normal for degree of bladder distention. Other: None. IMPRESSION: Mild increased echogenicity of the renal cortices without significant thinning, consistent with chronic medical renal disease. Electronically Signed   By: Aliene Lloyd M.D.   On: 07/24/2024 12:09   CT ABDOMEN PELVIS WO CONTRAST Result Date:  07/23/2024 CLINICAL DATA:  Rectal bleeding since this morning. EXAM: CT ABDOMEN AND PELVIS WITHOUT CONTRAST TECHNIQUE: Multidetector CT imaging of the abdomen and pelvis was performed following the standard protocol without IV contrast. RADIATION DOSE REDUCTION: This exam was performed according to the departmental dose-optimization program which includes automated exposure control, adjustment of the mA and/or kV according to patient size and/or use of iterative reconstruction technique. COMPARISON:  None Available. FINDINGS: Lower chest: Basilar scarring changes and atelectasis. No infiltrates or effusions. No pulmonary edema. Mild cardiac enlargement but no pericardial effusion. Aortic and coronary artery calcifications. Hepatobiliary: No hepatic lesions are identified without contrast. The gallbladder is surgically absent. No intrahepatic biliary dilatation. Moderate common bile duct dilatation likely related to the patient's age and prior cholecystectomy. Pancreas: No mass, inflammation or ductal dilatation. Spleen: Normal size.  No focal lesions. Adrenals/Urinary Tract: Adrenal glands and kidneys are unremarkable. No renal, ureteral or bladder calculi or obvious mass without contrast. Stomach/Bowel: The stomach, duodenum, small bowel and colon are grossly normal. No mass lesions or obstructive findings. Moderate stool noted in the rectum. No rectal mass. Vascular/Lymphatic: Age related advanced atherosclerotic calcification involving the aorta and branch vessels but no aneurysm. No mesenteric or retroperitoneal mass or adenopathy. Small scattered lymph nodes are noted. Reproductive: The prostate gland is normal in size. The seminal vesicles are normal. Other: No pelvic mass or adenopathy. No free pelvic fluid collections. No inguinal mass or adenopathy. No abdominal wall hernia or subcutaneous lesions. Musculoskeletal: Advanced degenerative lumbar spondylosis no acute bony findings. Both hips are intact.  IMPRESSION: 1. No acute abdominal/pelvic findings, mass lesions or adenopathy. 2. Status post cholecystectomy with moderate common bile duct dilatation likely related to the patient's age and prior cholecystectomy. 3. Age related advanced atherosclerotic calcification involving the aorta and branch vessels. 4. Aortic atherosclerosis. Aortic Atherosclerosis (ICD10-I70.0). Electronically Signed   By: MYRTIS Stammer M.D.   On: 07/23/2024 14:59   CT HEAD WO CONTRAST ( ) Result Date: 07/23/2024 CLINICAL DATA:  Minor head trauma. EXAM: CT HEAD WITHOUT CONTRAST CT CERVICAL SPINE WITHOUT CONTRAST TECHNIQUE: Multidetector CT imaging of the head and cervical spine was performed following the standard protocol without intravenous contrast. Multiplanar CT image reconstructions of the cervical spine were also generated. RADIATION DOSE REDUCTION: This exam was performed according to the departmental dose-optimization program which includes automated exposure control, adjustment of the mA and/or kV according to patient size and/or use of iterative reconstruction technique. COMPARISON:  None Available. FINDINGS: CT HEAD FINDINGS Brain: Age related cerebral atrophy, ventriculomegaly and periventricular white matter disease. No extra-axial fluid collections are identified. No CT findings for acute hemispheric infarction or intracranial hemorrhage. No mass lesions. The brainstem and cerebellum are normal. Vascular: Scattered vascular  calcifications but no aneurysm or hyperdense vessels. Skull: No skull fracture or bone lesions. Sinuses/Orbits: The paranasal sinuses and mastoid air cells are clear. The globes are intact. Other: Evidence of prior/remote nasal bone fractures. No scalp lesions or scalp hematoma. CT CERVICAL SPINE FINDINGS Alignment: Normal overall alignment. Mild multilevel degenerative subluxations most notably at C7. Skull base and vertebrae: No acute fracture. No primary bone lesion or focal pathologic process.  Soft tissues and spinal canal: No prevertebral fluid or swelling. No visible canal hematoma. Disc levels: The spinal canal is fairly generous. No significant canal stenosis. Mild multilevel foraminal narrowing due to uncinate spurring and facet disease. Upper chest: The lung apices are grossly clear. Moderate distension of the cervical esophagus. Other: Atherosclerotic calcifications involving the carotid arteries. No mass, adenopathy or hematoma. IMPRESSION: 1. Age related cerebral atrophy, ventriculomegaly and periventricular white matter disease. 2. No acute intracranial findings or skull fracture. 3. Normal overall alignment of the cervical vertebral bodies and no acute fracture. 4. Multilevel degenerative cervical spondylosis with mild multilevel foraminal narrowing due to uncinate spurring and facet disease. Electronically Signed   By: MYRTIS Stammer M.D.   On: 07/23/2024 14:54   CT Cervical Spine Wo Contrast Result Date: 07/23/2024 CLINICAL DATA:  Minor head trauma. EXAM: CT HEAD WITHOUT CONTRAST CT CERVICAL SPINE WITHOUT CONTRAST TECHNIQUE: Multidetector CT imaging of the head and cervical spine was performed following the standard protocol without intravenous contrast. Multiplanar CT image reconstructions of the cervical spine were also generated. RADIATION DOSE REDUCTION: This exam was performed according to the departmental dose-optimization program which includes automated exposure control, adjustment of the mA and/or kV according to patient size and/or use of iterative reconstruction technique. COMPARISON:  None Available. FINDINGS: CT HEAD FINDINGS Brain: Age related cerebral atrophy, ventriculomegaly and periventricular white matter disease. No extra-axial fluid collections are identified. No CT findings for acute hemispheric infarction or intracranial hemorrhage. No mass lesions. The brainstem and cerebellum are normal. Vascular: Scattered vascular calcifications but no aneurysm or hyperdense  vessels. Skull: No skull fracture or bone lesions. Sinuses/Orbits: The paranasal sinuses and mastoid air cells are clear. The globes are intact. Other: Evidence of prior/remote nasal bone fractures. No scalp lesions or scalp hematoma. CT CERVICAL SPINE FINDINGS Alignment: Normal overall alignment. Mild multilevel degenerative subluxations most notably at C7. Skull base and vertebrae: No acute fracture. No primary bone lesion or focal pathologic process. Soft tissues and spinal canal: No prevertebral fluid or swelling. No visible canal hematoma. Disc levels: The spinal canal is fairly generous. No significant canal stenosis. Mild multilevel foraminal narrowing due to uncinate spurring and facet disease. Upper chest: The lung apices are grossly clear. Moderate distension of the cervical esophagus. Other: Atherosclerotic calcifications involving the carotid arteries. No mass, adenopathy or hematoma. IMPRESSION: 1. Age related cerebral atrophy, ventriculomegaly and periventricular white matter disease. 2. No acute intracranial findings or skull fracture. 3. Normal overall alignment of the cervical vertebral bodies and no acute fracture. 4. Multilevel degenerative cervical spondylosis with mild multilevel foraminal narrowing due to uncinate spurring and facet disease. Electronically Signed   By: MYRTIS Stammer M.D.   On: 07/23/2024 14:54    Microbiology: No results found for this or any previous visit.  Labs: CBC: Recent Labs  Lab 07/23/24 1203 07/24/24 0506  WBC 8.5 7.6  HGB 12.3* 10.3*  HCT 36.7* 30.9*  MCV 114.7* 116.2*  PLT 206 142*   Basic Metabolic Panel: Recent Labs  Lab 07/23/24 1203 07/24/24 0506  NA 134* 136  K  5.6* 4.4  CL 100 107  CO2 21* 22  GLUCOSE 97 88  BUN 43* 35*  CREATININE 1.92* 1.51*  CALCIUM 9.2 8.3*   Liver Function Tests: Recent Labs  Lab 07/23/24 1203  AST 112*  ALT 28  ALKPHOS 90  BILITOT 1.1  PROT 8.2*  ALBUMIN 3.7   CBG: No results for input(s):  GLUCAP in the last 168 hours.  Discharge time spent: greater than 30 minutes.  This record has been created using Conservation officer, historic buildings. Errors have been sought and corrected,but may not always be located. Such creation errors do not reflect on the standard of care.   Signed: Amaryllis Dare, MD [facility] 07/24/2024

## 2024-07-24 NOTE — Hospital Course (Addendum)
 Gary Lane is a 89 y.o. male with medical history significant of paroxysmal atrial fibrillation not on any anticoagulation, prior cholecystectomy and a prostate procedure, came to ED with concern of having small amount of bleeding per rectum when he was trying to strain due to constipation.   Per patient he had a fall when he slipped out of bed and unable to get up himself 2 weeks ago, no reported injuries as imaging was obtained by PCP.  Due to aches and pains he was given opioid by PCP.  Patient also has a recent dental infection for which he just completed a course of Augmentin.   Patient developed constipation, poor p.o. intake, he started taking some laxative but only had small amount of watery bowel movement, due to excessive straining he saw some bleeding per rectum which prompted him to come to ED.  On arrival hemodynamically stable, CBC seems stable, does have significant macrocytosis, labs with mild hyponatremia with sodium at 134, hyperkalemia with potassium of 5.6, CO2 21, BUN 43, creatinine 1.92 with baseline around 1.1, AST 112, lipase 56. EKG with atrial fibrillation.   CT head and cervical spine was negative for any acute abnormality, did show age-related cerebral atrophy, ventriculomegaly and periventricular white matter disease.   CT abdomen and pelvis with no acute abnormality.  Post cholecystectomy changes.  Distended bladder.   Patient was given 1 L of bolus, Lokelma and enema ordered.   10/21: Vital stable, hemoglobin with some decreased to 10.3, hyperkalemia resolved, creatinine improving now at 1.51.  No further bleeding reported and patient has a good bowel movement.  Patient continued to have significant postvoid volume.  Renal ultrasound with concern of medical renal disease and no hydronephrosis noted.  Patient was started on Flomax and need to have an outpatient follow-up with urology.  Anemia panel with anemia of chronic disease and mild iron deficiency, started  on iron supplement.  Patient was also given bowel regimen and he should avoid opioids to prevent further constipation.  11/27: Hemodynamically stable, still feeling weak but does not want to go to SNF as recommended by physical therapy.  Home health services ordered.  Patient was originally discharged on 10/21 but developed some chills before leaving so discharge was held, next morning he became febrile.  He was treated with broad-spectrum antibiotics and has met criteria for sepsis with fever and developed leukocytosis.  No obvious source of infection found, blood cultures remain negative and urine cultures were not sent.  He completed a 5-day course of antibiotic.  Foley catheter was also placed due to persistent urinary retention.  Urology outpatient appointment was made with Dr. Twylla for 08/14/2024 for further evaluation and management of his urinary retention and BPH.  Patient was instructed to keep himself well hydrated and need to have a close follow-up with his primary care provider and urology for further assistance.

## 2024-07-24 NOTE — Evaluation (Signed)
 Physical Therapy Evaluation Patient Details Name: Gary Lane MRN: [id number] DOB: Nov 05, 1929 Today's Date: 07/24/2024  History of Present Illness  Pt is a 89 y/o M presenting to ED with c/o rectal bleeding and constipation. MD assessment includes AKI, acute urinary retention, and hyperkalemia. PMH significant for paroxysmal afib, prior cholecystectomy and prostate procedure.   Clinical Impression  Pt A&Ox4, agreeable to PT evaluation. At baseline, pt reports using [facility] or rollator for ambulation, denies hx of falls, IND with ADLs. Pt was met semi-supine in bed, CGA for bed mobility with heavy use of bed features. Very effortful for pt to complete all mobility tasks this date. Pt amb ~84ft with RW, self-limited distance due to fatigue, no overt LOB but was unsteady at times with intermittent scissoring gait pattern when performing turns. HR and SpO2 monitored throughout session, HR ranged from 98-120bpm following mobility tasks, SpO2 >97% throughout on RA. Pt was left seated at EOB at end of session, OT present in room, all needs in reach. Pt is displaying deficits in strength, activity tolerance, and balance and would benefit from skilled PT intervention to address listed deficits and improve independence with mobility/ADLs.         If plan is discharge home, recommend the following: A little help with walking and/or transfers;A little help with bathing/dressing/bathroom;Assistance with cooking/housework;Direct supervision/assist for medications management;Assist for transportation   Can travel by private vehicle   Yes    Equipment Recommendations Other (comment) (TBD)  Recommendations for Other Services       Functional Status Assessment Patient has had a recent decline in their functional status and demonstrates the ability to make significant improvements in function in a reasonable and predictable amount of time.     Precautions / Restrictions Precautions Precautions:  Fall Recall of Precautions/Restrictions: Impaired Restrictions Weight Bearing Restrictions Per Provider Order: No      Mobility  Bed Mobility Overal bed mobility: Needs Assistance Bed Mobility: Supine to Sit     Supine to sit: Contact guard, HOB elevated, Used rails     General bed mobility comments: no physical assistance required, heavy use of bed features, very effortful for pt to achieve sitting EOB    Transfers Overall transfer level: Needs assistance Equipment used: Rolling walker (2 wheels) Transfers: Sit to/from Stand Sit to Stand: Min assist, From elevated surface           General transfer comment: minA to stabilize RW due to pt pulling to stand    Ambulation/Gait Ambulation/Gait assistance: Contact guard assist Gait Distance (Feet): 20 Feet Assistive device: Rolling walker (2 wheels) Gait Pattern/deviations: Step-through pattern, Decreased stride length, Trunk flexed, Narrow base of support Gait velocity: decreased     General Gait Details: pt only able to tolerate short bout of amb due to fatigue, no overt LOB however had intermittent scissoring gait pattern with turns. Very narrow BOS throughout  Stairs            Wheelchair Mobility     Tilt Bed    Modified Rankin (Stroke Patients Only)       Balance Overall balance assessment: Needs assistance Sitting-balance support: Feet supported Sitting balance-Leahy Scale: Fair Sitting balance - Comments: required UE support for dynamic sitting   Standing balance support: Bilateral upper extremity supported, Reliant on assistive device for balance Standing balance-Leahy Scale: Fair Standing balance comment: heavy BUE support on RW  Pertinent Vitals/Pain Pain Assessment Pain Assessment: No/denies pain    Home Living Family/patient expects to be discharged to:: Private residence Living Arrangements: Children (lives with son) Available Help at  Discharge: Family;Available PRN/intermittently (son works during the day) Type of Home: House Home Access: Level entry       Home Layout: One level Home Equipment: Rollator (4 wheels);Cane - single point;BSC/3in1 Additional Comments: Pt's son works during the day. He uses 3 in 1 in shower as shower seat    Prior Function Prior Level of Function : Independent/Modified Independent             Mobility Comments: Pt reports using SPC for ambulation, has a rollator that he would use for longer distances, denies hx of falls ADLs Comments: IND with ADLs and son assists with IADLs     Extremity/Trunk Assessment   Upper Extremity Assessment Upper Extremity Assessment: Generalized weakness    Lower Extremity Assessment Lower Extremity Assessment: Generalized weakness       Communication   Communication Communication: Impaired Factors Affecting Communication: Hearing impaired    Cognition Arousal: Alert Behavior During Therapy: WFL for tasks assessed/performed, Flat affect   PT - Cognitive impairments: No apparent impairments                       PT - Cognition Comments: A&Ox4 Following commands: Intact       Cueing Cueing Techniques: Verbal cues, Visual cues     General Comments      Exercises Other Exercises Other Exercises: HR and SpO2 monitored during session: HR 120bpm after supine > sit transfer that recovered to 108bpm. After short bout of amb HR 118bpm recovered to 98bpm with seated rest. SpO2 >97% throughout with pt on RA.   Assessment/Plan    PT Assessment Patient needs continued PT services  PT Problem List Decreased strength;Decreased activity tolerance;Decreased balance;Decreased mobility       PT Treatment Interventions DME instruction;Gait training;Functional mobility training;Therapeutic activities;Therapeutic exercise;Balance training;Neuromuscular re-education;Patient/family education    PT Goals (Current goals can be found in the  Care Plan section)  Acute Rehab PT Goals Patient Stated Goal: to go home PT Goal Formulation: With patient Time For Goal Achievement: 08/07/24 Potential to Achieve Goals: Fair    Frequency Min 2X/week     Co-evaluation               AM-PAC PT 6 Clicks Mobility  Outcome Measure Help needed turning from your back to your side while in a flat bed without using bedrails?: A Little Help needed moving from lying on your back to sitting on the side of a flat bed without using bedrails?: A Little Help needed moving to and from a bed to a chair (including a wheelchair)?: A Little Help needed standing up from a chair using your arms (e.g., wheelchair or bedside chair)?: A Little Help needed to walk in hospital room?: A Little Help needed climbing 3-5 steps with a railing? : A Lot 6 Click Score: 17    End of Session Equipment Utilized During Treatment: Gait belt Activity Tolerance: Patient limited by fatigue Patient left: in bed;with call bell/phone within reach (OT in room) Nurse Communication: Mobility status PT Visit Diagnosis: Unsteadiness on feet (R26.81);Muscle weakness (generalized) (M62.81);Other abnormalities of gait and mobility (R26.89);Difficulty in walking, not elsewhere classified (R26.2)    Time: 8982-8968 PT Time Calculation (min) (ACUTE ONLY): 14 min   Charges:   PT Evaluation $PT Eval Low Complexity: 1 Low  PT General Charges $$ ACUTE PT VISIT: 1 Visit        Eligha Kmetz, SPT

## 2024-07-24 NOTE — Discharge Instructions (Signed)
 [facility] will contact patient to schedule physical therapy assessment.  [phone]

## 2024-07-24 NOTE — TOC Transition Note (Signed)
 Transition of Care [facility]) - Discharge Note   Patient Details  Name: Gary Lane MRN: [id number] Date of Birth: March 10, 1930  Transition of Care [facility]) CM/SW Contact:  Alfonso Rummer, LCSW Phone Number: 07/24/2024, 3:43 PM   Clinical Narrative:    Pt son will transport [name] via private transportation. [facility] confirmed services and contact pt. TOC signing off        Patient Goals and CMS Choice            Discharge Placement       Home with [facility]                 Discharge Plan and Services Additional resources added to the After Visit Summary for                                       Social Drivers of Health (SDOH) Interventions SDOH Screenings   Food Insecurity: No Food Insecurity (07/23/2024)  Housing: Patient Declined (07/23/2024)  Transportation Needs: No Transportation Needs (07/23/2024)  Utilities: Not At Risk (07/23/2024)  Financial Resource Strain: Low Risk  (08/24/2023)   Received from [facility]  Social Connections: Patient Declined (07/23/2024)  Tobacco Use: Low Risk  (07/23/2024)     Readmission Risk Interventions     No data to display

## 2024-07-24 NOTE — Evaluation (Signed)
 Occupational Therapy Evaluation Patient Details Name: Gary Lane MRN: [id number] DOB: 07/15/1930 Today's Date: 07/24/2024   History of Present Illness   Pt is a 89 y/o M presenting to ED with c/o rectal bleeding and constipation. MD assessment includes AKI, acute urinary retention, and hyperkalemia. PMH significant for paroxysmal afib, prior cholecystectomy and prostate procedure.     Clinical Impressions Patient presenting with decreased Ind in self care,balance, functional mobility/transfers, endurance, and safety awareness. Patient reports living at home with son and uses cane for mobility tasks. Pt endorses being Ind at baseline with self care tasks and son assisting with IADLs. Pt is very fatigued and transitioned from PT session while seated on EOB. Pt needing assistance to don/doff B socks and to return to bed. Pt having ambulated a minimal distance with PT prior to this session yet pt feels he is unable to do more. Pt is far from baseline. Son works during the day. Patient will benefit from acute OT to increase overall independence in the areas of ADLs, functional mobility, and safety awareness in order to safely discharge.     If plan is discharge home, recommend the following:   A lot of help with walking and/or transfers;A lot of help with bathing/dressing/bathroom;Assistance with cooking/housework;Help with stairs or ramp for entrance;Assist for transportation     Functional Status Assessment   Patient has had a recent decline in their functional status and demonstrates the ability to make significant improvements in function in a reasonable and predictable amount of time.     Equipment Recommendations   Other (comment) (defer to next venue of care)      Precautions/Restrictions   Precautions Precautions: Fall Recall of Precautions/Restrictions: Impaired     Mobility Bed Mobility Overal bed mobility: Needs Assistance Bed Mobility: Sit to Supine        Sit to supine: Min assist        Transfers Overall transfer level: Needs assistance Equipment used: Rolling walker (2 wheels) Transfers: Sit to/from Stand Sit to Stand: Min assist, From elevated surface                  Balance Overall balance assessment: Needs assistance Sitting-balance support: Feet supported Sitting balance-Leahy Scale: Fair                                     ADL either performed or assessed with clinical judgement   ADL Overall ADL's : Needs assistance/impaired             Lower Body Bathing: Moderate assistance Lower Body Bathing Details (indicate cue type and reason): for LB dressing with sit <>stand from EOB                             Vision Patient Visual Report: No change from baseline              Pertinent Vitals/Pain Pain Assessment Pain Assessment: No/denies pain     Extremity/Trunk Assessment Upper Extremity Assessment Upper Extremity Assessment: Generalized weakness   Lower Extremity Assessment Lower Extremity Assessment: Generalized weakness       Communication Communication Communication: Impaired Factors Affecting Communication: Hearing impaired   Cognition Arousal: Alert Behavior During Therapy: WFL for tasks assessed/performed, Flat affect Cognition: No apparent impairments  Following commands: Intact       Cueing  General Comments   Cueing Techniques: Verbal cues;Visual cues              Home Living Family/patient expects to be discharged to:: Private residence Living Arrangements: Children (lives with son) Available Help at Discharge: Family;Available PRN/intermittently (son works during the day) Type of Home: House Home Access: Level entry     Home Layout: One level     Bathroom Shower/Tub: Tub/shower unit;Sponge bathes at baseline         Home Equipment: Rollator (4 wheels);Cane - single point;BSC/3in1    Additional Comments: Pt's son works during the day. He uses 3 in 1 in shower as shower seat      Prior Functioning/Environment Prior Level of Function : Independent/Modified Independent             Mobility Comments: Pt reports using SPC for ambulation, has a rollator that he would use for longer distances, denies hx of falls ADLs Comments: IND with ADLs and son assists with IADLs    OT Problem List: Decreased strength;Decreased activity tolerance;Impaired balance (sitting and/or standing);Decreased safety awareness   OT Treatment/Interventions: Self-care/ADL training;Therapeutic exercise;Patient/family education;Balance training;Energy conservation;Therapeutic activities      OT Goals(Current goals can be found in the care plan section)   Acute Rehab OT Goals Patient Stated Goal: to get stronger and go home OT Goal Formulation: With patient Time For Goal Achievement: 08/07/24 Potential to Achieve Goals: Fair ADL Goals Pt Will Perform Grooming: with supervision Pt Will Perform Lower Body Dressing: with supervision Pt Will Transfer to Toilet: with supervision Pt Will Perform Toileting - Clothing Manipulation and hygiene: with supervision   OT Frequency:  Min 2X/week       AM-PAC OT 6 Clicks Daily Activity     Outcome Measure Help from another person eating meals?: None Help from another person taking care of personal grooming?: A Little Help from another person toileting, which includes using toliet, bedpan, or urinal?: A Lot Help from another person bathing (including washing, rinsing, drying)?: A Lot Help from another person to put on and taking off regular upper body clothing?: A Little Help from another person to put on and taking off regular lower body clothing?: A Lot 6 Click Score: 16   End of Session Equipment Utilized During Treatment: Rolling walker (2 wheels) Nurse Communication: Mobility status  Activity Tolerance: Patient tolerated treatment  well Patient left: in bed;with call bell/phone within reach;with bed alarm set  OT Visit Diagnosis: Unsteadiness on feet (R26.81);Repeated falls (R29.6);Muscle weakness (generalized) (M62.81)                Time: 8972-8956 OT Time Calculation (min): 16 min Charges:  OT General Charges $OT Visit: 1 Visit OT Evaluation $OT Eval Moderate Complexity: 1 [address], [place], OTR/L , CBIS ascom [phone]  07/24/24, 12:15 PM

## 2024-07-24 NOTE — TOC Initial Note (Signed)
 Transition of Care [facility]) - Initial/Assessment Note    Patient Details  Name: Gary Lane MRN: [id number] Date of Birth: Feb 26, 1930  [facility]) CM/SW Contact:    Alfonso Rummer, LCSW Phone Number: 07/24/2024, 3:06 PM  Clinical Narrative:                 LCSW A Figueora met with pt in room 225 to advise of recommendations and purpose for [place] visit. LCSW A Junnie Loschiavo advise pt of skilled nursing facillity recommendation. Pt declines SNF and request home health services. Pt and son report he is active with [facility]. Pt reports he lives with son, and confirmed pt resides at his home. LCSW A. Rheta Hemmelgarn contacted [place]  [facility] with [facility] and confirmed pt is active with home health serivces. Pt son reports he will pick up patient when he is medically ready for discharge.         Patient Goals and CMS Choice            Expected Discharge Plan and Services      Home with [facility].                                         Prior Living Arrangements/Services                       Activities of Daily Living   ADL Screening (condition at time of admission) Independently performs ADLs?: No Does the patient have a NEW difficulty with bathing/dressing/toileting/self-feeding that is expected to last >3 days?: Yes (Initiates electronic notice to provider for possible OT consult) Does the patient have a NEW difficulty with getting in/out of bed, walking, or climbing stairs that is expected to last >3 days?: Yes (Initiates electronic notice to provider for possible PT consult) Does the patient have a NEW difficulty with communication that is expected to last >3 days?: Yes (Initiates electronic notice to provider for possible SLP consult) Is the patient deaf or have difficulty hearing?: Yes Does the patient have difficulty seeing, even when wearing glasses/contacts?: Yes Does the patient have difficulty concentrating,  remembering, or making decisions?: No  Permission Sought/Granted                  Emotional Assessment              Admission diagnosis:  Other constipation [K59.09] Hyperkalemia [E87.5] Rectal bleeding [K62.5] AKI (acute kidney injury) [N17.9] Patient Active Problem List   Diagnosis Date Noted   AKI (acute kidney injury) 07/23/2024   Rectal bleeding 07/23/2024   Acute urinary retention 07/23/2024   Hyperkalemia 07/23/2024   Constipation 07/23/2024   Extremity cyanosis 11/29/2023   PAD (peripheral artery disease) 11/29/2023   Permanent atrial fibrillation (HCC)    PCP:  Alla Amis, MD Pharmacy:   [id number] [place], [place] - [address] AT [facility] OF [address] [place] [address] Phone: [phone] Fax: [phone]     Social Drivers of Health (SDOH) Social History: SDOH Screenings   Food Insecurity: No Food Insecurity (07/23/2024)  Housing: Patient Declined (07/23/2024)  Transportation Needs: No Transportation Needs (07/23/2024)  Utilities: Not At Risk (07/23/2024)  Financial Resource Strain: Low Risk  (08/24/2023)   Received from [facility]  Social Connections: Patient Declined (07/23/2024)  Tobacco Use: Low Risk  (07/23/2024)   SDOH Interventions:     Readmission Risk Interventions     No data to display

## 2024-07-24 NOTE — Care Management Obs Status (Signed)
 MEDICARE OBSERVATION STATUS NOTIFICATION   Patient Details  Name: LINN GOETZE MRN: [id number] Date of Birth: 1929-12-02   Medicare Observation Status Notification Given:  Yes    Rojelio SHAUNNA Rattler 07/24/2024, 12:39 PM

## 2024-07-24 NOTE — Plan of Care (Signed)
 IV removed, discharge instructions reviewed and patient discharged to home

## 2024-07-25 ENCOUNTER — Observation Stay

## 2024-07-25 DIAGNOSIS — N179 Acute kidney failure, unspecified: Secondary | ICD-10-CM | POA: Diagnosis not present

## 2024-07-25 LAB — URINALYSIS, COMPLETE (UACMP) WITH MICROSCOPIC
Bilirubin Urine: NEGATIVE
Glucose, UA: NEGATIVE mg/dL
Ketones, ur: NEGATIVE mg/dL
Leukocytes,Ua: NEGATIVE
Nitrite: NEGATIVE
Protein, ur: 100 mg/dL — AB
Specific Gravity, Urine: 1.014 (ref 1.005–1.030)
pH: 5 (ref 5.0–8.0)

## 2024-07-25 LAB — CBC
HCT: 32 % — ABNORMAL LOW (ref 39.0–52.0)
Hemoglobin: 10.6 g/dL — ABNORMAL LOW (ref 13.0–17.0)
MCH: 38.7 pg — ABNORMAL HIGH (ref 26.0–34.0)
MCHC: 33.1 g/dL (ref 30.0–36.0)
MCV: 116.8 fL — ABNORMAL HIGH (ref 80.0–100.0)
Platelets: 147 K/uL — ABNORMAL LOW (ref 150–400)
RBC: 2.74 MIL/uL — ABNORMAL LOW (ref 4.22–5.81)
RDW: 15.4 % (ref 11.5–15.5)
WBC: 10.1 K/uL (ref 4.0–10.5)
nRBC: 0 % (ref 0.0–0.2)

## 2024-07-25 LAB — RESPIRATORY PANEL BY PCR

## 2024-07-25 LAB — BASIC METABOLIC PANEL WITH GFR
Anion gap: 10 (ref 5–15)
BUN: 27 mg/dL — ABNORMAL HIGH (ref 8–23)
CO2: 25 mmol/L (ref 22–32)
Calcium: 8.3 mg/dL — ABNORMAL LOW (ref 8.9–10.3)
Chloride: 103 mmol/L (ref 98–111)
Creatinine, Ser: 1.28 mg/dL — ABNORMAL HIGH (ref 0.61–1.24)
GFR, Estimated: 53 mL/min — ABNORMAL LOW (ref >60)
Glucose, Bld: 98 mg/dL (ref 70–99)
Potassium: 4.5 mmol/L (ref 3.5–5.1)
Sodium: 138 mmol/L (ref 135–145)

## 2024-07-25 LAB — LACTIC ACID, PLASMA
Lactic Acid, Venous: 2.3 mmol/L (ref 0.5–1.9)
Lactic Acid, Venous: 2.2 mmol/L (ref 0.5–1.9)

## 2024-07-25 LAB — PROCALCITONIN: Procalcitonin: 0.54 ng/mL

## 2024-07-25 MED ORDER — VANCOMYCIN HCL 1500 MG/300ML IV SOLN
1500.0000 mg | Freq: Once | INTRAVENOUS | Status: AC
  Administered 2024-07-25: 1500 mg via INTRAVENOUS
  Filled 2024-07-25: qty 300

## 2024-07-25 MED ORDER — IBUPROFEN 400 MG PO TABS
400.0000 mg | ORAL_TABLET | Freq: Once | ORAL | Status: AC
Start: 2024-07-25 — End: 2024-07-25
  Administered 2024-07-25: 400 mg via ORAL
  Filled 2024-07-25: qty 1

## 2024-07-25 MED ORDER — LACTATED RINGERS IV SOLN: INTRAVENOUS | Status: AC

## 2024-07-25 MED ORDER — PIPERACILLIN-TAZOBACTAM 3.375 G IVPB
3.3750 g | Freq: Three times a day (TID) | INTRAVENOUS | Status: AC
  Administered 2024-07-25 – 2024-07-29 (×13): 3.375 g via INTRAVENOUS
  Filled 2024-07-25 (×13): qty 50

## 2024-07-25 MED ORDER — ENOXAPARIN SODIUM 40 MG/0.4ML IJ SOSY
40.0000 mg | PREFILLED_SYRINGE | INTRAMUSCULAR | Status: DC
  Administered 2024-07-25 – 2024-07-29 (×5): 40 mg via SUBCUTANEOUS
  Filled 2024-07-25 (×5): qty 0.4

## 2024-07-25 MED ORDER — LACTATED RINGERS IV BOLUS
500.0000 mL | Freq: Once | INTRAVENOUS | Status: DC
Start: 2024-07-25 — End: 2024-07-30

## 2024-07-25 MED ORDER — MIDODRINE HCL 5 MG PO TABS
10.0000 mg | ORAL_TABLET | Freq: Three times a day (TID) | ORAL | Status: DC
  Administered 2024-07-26 – 2024-07-30 (×14): 10 mg via ORAL
  Filled 2024-07-25 (×14): qty 2

## 2024-07-25 MED ORDER — VANCOMYCIN HCL IN DEXTROSE 1-5 GM/200ML-% IV SOLN
1000.0000 mg | INTRAVENOUS | Status: AC
  Administered 2024-07-26 – 2024-07-29 (×4): 1000 mg via INTRAVENOUS
  Filled 2024-07-25 (×5): qty 200

## 2024-07-25 MED ORDER — LACTATED RINGERS IV BOLUS
500.0000 mL | Freq: Once | INTRAVENOUS | Status: AC
  Administered 2024-07-25: 500 mL via INTRAVENOUS

## 2024-07-25 MED ORDER — CHLORHEXIDINE GLUCONATE CLOTH 2 % EX PADS
6.0000 | MEDICATED_PAD | Freq: Every day | CUTANEOUS | Status: DC
  Administered 2024-07-25 – 2024-07-30 (×6): 6 via TOPICAL

## 2024-07-25 NOTE — Progress Notes (Signed)
 PT Cancellation Note  Patient Details Name: Gary Lane MRN: [id number] DOB: May 03, 1930   Cancelled Treatment:    Reason Eval/Treat Not Completed: Medical issues which prohibited therapy Will re-attempt at later time/date as appropriate.  Darious Rehman 07/25/2024, 12:52 PM

## 2024-07-25 NOTE — Progress Notes (Signed)
 Pharmacy Antibiotic Note  Gary Lane is a 89 y.o. male w/ PMH of BPH, atrial fibrillation admitted on 07/23/2024 with sepsis.  Pharmacy has been consulted for vancomycin and Zosyn dosing.  Plan:  1) start Zosyn 3.375g IV q8h (4 hour infusion).  2) start vancomycin 1500 mg IV x 1 then 1000 mg IV every 24 hours Goal AUC 400-550. Expected AUC: 509.8 SCr used: 1.3 mg/dL   Height: 6' (817.0 cm) Weight: 74.8 kg (164 lb 14.5 oz) IBW/kg (Calculated) : 77.6  Temp (24hrs), Avg:100 F (37.8 C), Min:98 F (36.7 C), Max:103.9 F (39.9 C)  Recent Labs  Lab 07/23/24 1203 07/24/24 0506 07/25/24 0827 07/25/24 1337  WBC 8.5 7.6 10.1  --   CREATININE 1.92* 1.51* 1.28*  --   LATICACIDVEN  --   --   --  2.3*    Estimated Creatinine Clearance: 39 mL/min (A) (by C-G formula based on SCr of 1.28 mg/dL (H)).    No Known Allergies  Antimicrobials this admission: 10/22 vancomycin >> 10/22 Zosyn >>   Microbiology results: 10/22 BCx: pending  Thank you for allowing pharmacy to be a part of this patient's care.  [name] 07/25/2024 2:46 PM

## 2024-07-25 NOTE — Progress Notes (Signed)
 SLP Cancellation Note  Patient Details Name: Gary Lane MRN: [id number] DOB: 01-06-1930   Cancelled treatment:       Reason Eval/Treat Not Completed: SLP screened, no needs identified, will sign off (chart reviewed; consulted NSG to discuss pt's status yesterday and today.)  Pt is a 89 y/o M presenting to ED with c/o rectal bleeding and constipation. MD assessment includes AKI, acute urinary retention, and hyperkalemia. PMH significant for apparent HOH, paroxysmal afib, prior cholecystectomy and prostate procedure. Per NSG note today, 19/22, patient having difficulty voiding. Straight cath per order, clear yellow urine noted. Patient tolerated well. Encouraged fluids.. NSG is repeating a catheter this morning.  Pt is living w/ his Son d/t recent illness. The plan per NSG was for pt to return to Son's home at D/C.  Pt denied any difficulty swallowing and is currently on a regular diet; tolerates swallowing pills w/ water per NSG - a whole handful this morning w/ water. Pt was sipping on chocolate milk at visit; water provided also. Pt conversed in conversation w/out overt expressive/receptive deficits noted; pt denied any speech-language deficits. Speech intelligible.  Pt does appear HOH which impacts his communication engagement at times -- recommend increasing volume when speaking to him for clear understanding and follow through. No further skilled ST services indicated as pt appears at his baseline. Pt agreed. NSG to reconsult if any change in status while admitted.      Comer Portugal, MS, CCC-SLP Speech Language Pathologist Rehab Services; [facility] [phone] (ascom) Aralynn Brake 07/25/2024, 11:13 AM

## 2024-07-25 NOTE — Plan of Care (Signed)
  Problem: Nutrition Goal: Patient maintains adequate hydration Outcome: Progressing   Problem: Nutrition Goal: Patient/Family demonstrates understanding of diet Outcome: Progressing   Problem: Nutrition Goal: Patient/Family independently completes tube feeding Outcome: Progressing

## 2024-07-25 NOTE — Progress Notes (Signed)
 Patient is lethargic this evening. Denies any additional pain or discomfort. Patient having difficulty voiding. Straight cath per order, clear yellow urine noted. Patient tolerated well. Encouraged fluids.

## 2024-07-25 NOTE — Plan of Care (Signed)
  Problem: Nutrition Goal: Patient maintains adequate hydration Outcome: Adequate for Discharge Goal: Patient maintains weight Outcome: Adequate for Discharge Goal: Patient/Family demonstrates understanding of diet Outcome: Adequate for Discharge Goal: Patient/Family independently completes tube feeding Outcome: Adequate for Discharge Goal: Patient will have no more than 5 lb weight change during LOS Outcome: Adequate for Discharge Goal: Patient will utilize adaptive techniques to administer nutrition Outcome: Adequate for Discharge Goal: Patient will verbalize dietary restrictions Outcome: Adequate for Discharge

## 2024-07-25 NOTE — Progress Notes (Signed)
 Progress Note   Patient: Gary Lane DOB: Mar 23, 1930 DOA: 07/23/2024     0 DOS: the patient was seen and examined on 07/25/2024   Brief hospital course:  Gary Lane is a 89 y.o. male with medical history significant of paroxysmal atrial fibrillation not on any anticoagulation, prior cholecystectomy and a prostate procedure, came to ED with concern of having small amount of bleeding per rectum when he was trying to strain due to constipation.   Per patient he had a fall when he slipped out of bed and unable to get up himself 2 weeks ago, no reported injuries as imaging was obtained by PCP.  Due to aches and pains he was given opioid by PCP.  Patient also has a recent dental infection for which he just completed a course of Augmentin.   Patient developed constipation, poor p.o. intake, he started taking some laxative but only had small amount of watery bowel movement, due to excessive straining he saw some bleeding per rectum which prompted him to come to ED.   Patient denies any abdominal pain.  Was not urinating well since yesterday and also need to strain with urination for 1 day. Denies any dysuria or hematuria.   Patient denies any other recent illnesses, no upper respiratory symptoms.  No chest pain or shortness of breath.  No fever or chills.   ED course and data reviewed.  On arrival hemodynamically stable, CBC seems stable, does have significant macrocytosis, labs with mild hyponatremia with sodium at 134, hyperkalemia with potassium of 5.6, CO2 21, BUN 43, creatinine 1.92 with baseline around 1.1, AST 112, lipase 56. EKG with atrial fibrillation.   CT head and cervical spine was negative for any acute abnormality, did show age-related cerebral atrophy, ventriculomegaly and periventricular white matter disease.   CT abdomen and pelvis with no acute abnormality.  Post cholecystectomy changes.  Distended bladder.   Patient was given 1 L of bolus, Lokelma and  enema ordered.     Assessment and Plan:  Sepsis Unclear etiology Noted to have chills during rounds As evidenced by fever with a Tmax of 103.9, tachycardia and tachypnea Will obtain lactic acid and procalcitonin levels Blood cultures x 2 IV fluid hydration Empiric antibiotic therapy with vancomycin and Zosyn   * AKI (acute kidney injury) Likely secondary to  dehydration and urinary retention.   Creatinine at 1.92 with baseline around 1.1.   Renal function has improved with IV fluid hydration but not back to baseline. 1.92 >> 1.51 Continue gentle IV fluid hydration   Acute urinary retention Patient has a history of prostate procedure but does not take any Flomax at home. Recent use of pain medication and constipation may be contributory. Postvoid bladder volume more than 300 mL Foley catheter placed with drainage of 500 mL of urine    Hyperkalemia - Mild hyperkalemia with potassium of 5.6 likely secondary to AKI. - Patient received 1 dose of Lokelma - Repeat potassium level within normal limits   Rectal bleeding Had a small amount of bleeding with straining and constipation. Rectal exam done by EDP was negative for any bleeding. - H&H is stable   Constipation Likely secondary to opioid use. Per patient he was quite regular before that. - Bowel regimen   Permanent atrial fibrillation (HCC) Rate controlled.  Not on any medication or anticoagulation at home. Per patient he was taking Eliquis  long time ago and was stopped taking it for about a year after discussing with cardiologist as  he does not want to take anymore blood thinners.       Subjective: Acutely ill-appearing.  Complains of some abdominal discomfort and inability to void  Physical Exam: Vitals:   07/25/24 1100 07/25/24 1231 07/25/24 1330 07/25/24 1400  BP: (!) 130/54 (!) 91/57 (!) 92/47 (!) 92/50  Pulse: (!) 110 (!) 108 (!) 104 93  Resp: 17  18 17   Temp: (!) 103.9 F (39.9 C) (!) 102.1 F (38.9 C)  (!) 100.5 F (38.1 C) 100 F (37.8 C)  TempSrc: Rectal Rectal Rectal Rectal  SpO2: 96% 98% 96% 100%  Weight:      Height:       General.  Frail and hard of hearing elderly man, acutely ill-appearing Pulmonary.  Lungs clear bilaterally, normal respiratory effort. CV.  Regular rate and rhythm, no JVD, rub or murmur. Abdomen.  Soft, nontender, nondistended, BS positive. CNS.  Alert and oriented .  No focal neurologic deficit. Extremities.  No edema, no cyanosis, pulses intact and symmetrical.    Data Reviewed: Lactic acid 2.3, procalcitonin 0.44, BUN 27, creatinine 1.28, white count 10.1, hemoglobin 10.6 Labs reviewed  Family Communication: Plan of care discussed with patient and his son at the bedside.  All questions and concerns have been addressed.  They verbalized understanding and agreed with the plan.  Disposition: Status is: Observation The patient remains OBS appropriate and will d/c before 2 midnights.  Planned Discharge Destination: TBD    Time spent: 55 minutes  Author: Aimee Somerset, MD 07/25/2024 3:15 PM  For on call review [email or web address].

## 2024-07-26 ENCOUNTER — Inpatient Hospital Stay

## 2024-07-26 DIAGNOSIS — T402X5A Adverse effect of other opioids, initial encounter: Secondary | ICD-10-CM | POA: Diagnosis present

## 2024-07-26 DIAGNOSIS — H9190 Unspecified hearing loss, unspecified ear: Secondary | ICD-10-CM | POA: Diagnosis present

## 2024-07-26 DIAGNOSIS — Z7901 Long term (current) use of anticoagulants: Secondary | ICD-10-CM | POA: Diagnosis not present

## 2024-07-26 DIAGNOSIS — A419 Sepsis, unspecified organism: Secondary | ICD-10-CM | POA: Diagnosis present

## 2024-07-26 DIAGNOSIS — R54 Age-related physical debility: Secondary | ICD-10-CM | POA: Diagnosis present

## 2024-07-26 DIAGNOSIS — K5909 Other constipation: Secondary | ICD-10-CM | POA: Diagnosis present

## 2024-07-26 DIAGNOSIS — R339 Retention of urine, unspecified: Secondary | ICD-10-CM | POA: Diagnosis present

## 2024-07-26 DIAGNOSIS — E860 Dehydration: Secondary | ICD-10-CM | POA: Diagnosis present

## 2024-07-26 DIAGNOSIS — E871 Hypo-osmolality and hyponatremia: Secondary | ICD-10-CM | POA: Diagnosis present

## 2024-07-26 DIAGNOSIS — N401 Enlarged prostate with lower urinary tract symptoms: Secondary | ICD-10-CM | POA: Diagnosis present

## 2024-07-26 DIAGNOSIS — Z66 Do not resuscitate: Secondary | ICD-10-CM | POA: Diagnosis not present

## 2024-07-26 DIAGNOSIS — K5903 Drug induced constipation: Secondary | ICD-10-CM | POA: Diagnosis present

## 2024-07-26 DIAGNOSIS — D61818 Other pancytopenia: Secondary | ICD-10-CM | POA: Diagnosis not present

## 2024-07-26 DIAGNOSIS — E875 Hyperkalemia: Secondary | ICD-10-CM | POA: Diagnosis present

## 2024-07-26 DIAGNOSIS — N179 Acute kidney failure, unspecified: Secondary | ICD-10-CM | POA: Diagnosis present

## 2024-07-26 DIAGNOSIS — Z7982 Long term (current) use of aspirin: Secondary | ICD-10-CM | POA: Diagnosis not present

## 2024-07-26 DIAGNOSIS — Z9049 Acquired absence of other specified parts of digestive tract: Secondary | ICD-10-CM | POA: Diagnosis not present

## 2024-07-26 DIAGNOSIS — I4821 Permanent atrial fibrillation: Secondary | ICD-10-CM | POA: Diagnosis present

## 2024-07-26 DIAGNOSIS — K625 Hemorrhage of anus and rectum: Secondary | ICD-10-CM | POA: Diagnosis not present

## 2024-07-26 DIAGNOSIS — Z823 Family history of stroke: Secondary | ICD-10-CM | POA: Diagnosis not present

## 2024-07-26 LAB — CBC WITH DIFFERENTIAL/PLATELET
Abs Immature Granulocytes: 0.21 K/uL — ABNORMAL HIGH (ref 0.00–0.07)
Basophils Absolute: 0 K/uL (ref 0.0–0.1)
Basophils Relative: 0 %
Eosinophils Absolute: 0 K/uL (ref 0.0–0.5)
Eosinophils Relative: 0 %
HCT: 26.4 % — ABNORMAL LOW (ref 39.0–52.0)
Hemoglobin: 8.9 g/dL — ABNORMAL LOW (ref 13.0–17.0)
Immature Granulocytes: 2 %
Lymphocytes Relative: 6 %
Lymphs Abs: 0.7 K/uL (ref 0.7–4.0)
MCH: 38.9 pg — ABNORMAL HIGH (ref 26.0–34.0)
MCHC: 33.7 g/dL (ref 30.0–36.0)
MCV: 115.3 fL — ABNORMAL HIGH (ref 80.0–100.0)
Monocytes Absolute: 0.4 K/uL (ref 0.1–1.0)
Monocytes Relative: 4 %
Neutro Abs: 9.7 K/uL — ABNORMAL HIGH (ref 1.7–7.7)
Neutrophils Relative %: 88 %
Platelets: 116 K/uL — ABNORMAL LOW (ref 150–400)
RBC: 2.29 MIL/uL — ABNORMAL LOW (ref 4.22–5.81)
RDW: 14.9 % (ref 11.5–15.5)
WBC: 11 K/uL — ABNORMAL HIGH (ref 4.0–10.5)
nRBC: 0 % (ref 0.0–0.2)

## 2024-07-26 LAB — BASIC METABOLIC PANEL WITH GFR
Anion gap: 12 (ref 5–15)
BUN: 28 mg/dL — ABNORMAL HIGH (ref 8–23)
CO2: 18 mmol/L — ABNORMAL LOW (ref 22–32)
Calcium: 7.7 mg/dL — ABNORMAL LOW (ref 8.9–10.3)
Chloride: 105 mmol/L (ref 98–111)
Creatinine, Ser: 1.19 mg/dL (ref 0.61–1.24)
GFR, Estimated: 57 mL/min — ABNORMAL LOW (ref >60)
Glucose, Bld: 97 mg/dL (ref 70–99)
Potassium: 4.1 mmol/L (ref 3.5–5.1)
Sodium: 135 mmol/L (ref 135–145)

## 2024-07-26 LAB — LACTIC ACID, PLASMA: Lactic Acid, Venous: 1.9 mmol/L (ref 0.5–1.9)

## 2024-07-26 LAB — RESP PANEL BY RT-PCR (RSV, FLU A&B, COVID)  RVPGX2
Influenza A by PCR: NEGATIVE
Influenza B by PCR: NEGATIVE
Resp Syncytial Virus by PCR: NEGATIVE
SARS Coronavirus 2 by RT PCR: NEGATIVE

## 2024-07-26 MED ORDER — SODIUM CHLORIDE 0.9 % IV SOLN: INTRAVENOUS | Status: AC

## 2024-07-26 NOTE — IPAL (Signed)
  Interdisciplinary Goals of Care Family Meeting   Date carried out: 07/26/2024  Location of the meeting: Bedside  Member's involved: Physician and Family Member or next of kin  Durable Power of Attorney or Environmental health practitioner:     Discussion: We discussed goals of care for Gary Lane .  Discussed patient's condition and plan of care with him and his son at the bedside.  CODE STATUS was discussed and patient wishes to be a DO NOT RESUSCITATE.  All questions and concerns have been addressed.  They verbalized understanding and agreed with the plan.  Code status:   Code Status: Full Code   Disposition: Home  Time spent for the meeting: 16 minutes    Gary Corrigan, MD  07/26/2024, 3:24 PM

## 2024-07-26 NOTE — Plan of Care (Signed)
   Problem: Clinical Measurements: Goal: Ability to maintain clinical measurements within normal limits will improve Outcome: Progressing

## 2024-07-26 NOTE — Progress Notes (Signed)
 OT Cancellation Note  Patient Details Name: Gary Lane MRN: [id number] DOB: 1930/05/11   Cancelled Treatment:    Reason Eval/Treat Not Completed: Fatigue/lethargy limiting ability to participate. Pt with fever and low BP, pt too fatigued to work with OT at this time. OT will continue to follow and see at later date/time as appropriate.  Wendy Hoback L. Alcides Nutting, OTR/L  07/26/24, 2:14 PM

## 2024-07-26 NOTE — Progress Notes (Signed)
 Progress Note   Patient: Gary Lane FMW:[id number] DOB: August 06, 1930 DOA: 07/23/2024     0 DOS: the patient was seen and examined on 07/26/2024   Brief hospital course:  Gary Lane is a 89 y.o. male with medical history significant of paroxysmal atrial fibrillation not on any anticoagulation, prior cholecystectomy and a prostate procedure, came to ED with concern of having small amount of bleeding per rectum when he was trying to strain due to constipation.   Per patient he had a fall when he slipped out of bed and unable to get up himself 2 weeks ago, no reported injuries as imaging was obtained by PCP.  Due to aches and pains he was given opioid by PCP.  Patient also has a recent dental infection for which he just completed a course of Augmentin.   Patient developed constipation, poor p.o. intake, he started taking some laxative but only had small amount of watery bowel movement, due to excessive straining he saw some bleeding per rectum which prompted him to come to ED.   Patient denies any abdominal pain.  Was not urinating well since yesterday and also need to strain with urination for 1 day. Denies any dysuria or hematuria.   Patient denies any other recent illnesses, no upper respiratory symptoms.  No chest pain or shortness of breath.  No fever or chills.   ED course and data reviewed.  On arrival hemodynamically stable, CBC seems stable, does have significant macrocytosis, labs with mild hyponatremia with sodium at 134, hyperkalemia with potassium of 5.6, CO2 21, BUN 43, creatinine 1.92 with baseline around 1.1, AST 112, lipase 56. EKG with atrial fibrillation.   CT head and cervical spine was negative for any acute abnormality, did show age-related cerebral atrophy, ventriculomegaly and periventricular white matter disease.   CT abdomen and pelvis with no acute abnormality.  Post cholecystectomy changes.  Distended bladder.   Patient was given 1 L of bolus, Lokelma and  enema ordered.     Assessment and Plan:  Sepsis Unclear etiology Noted to have chills during rounds Continues to spike fever with a T max of 101 Lactic acid was elevated but shows a downward trend Procalcitonin level was elevated Blood cultures showed no growth in 24 hours Continue IV fluid hydration Continue empiric antibiotic therapy with vancomycin and Zosyn     * AKI (acute kidney injury) Likely secondary to  dehydration and urinary retention.   Creatinine at 1.92 with baseline around 1.1.   Renal function has improved with IV fluid hydration and is back to baseline 1.92 >> 1.28 >. 1.19 Continue gentle IV fluid hydration    Acute urinary retention Patient has a history of prostate procedure but does not take any Flomax at home. Recent use of pain medication and constipation may be contributory. Postvoid bladder volume more than 300 mL Foley catheter placed with drainage of 500 mL of urine Continue Flomax     Hyperkalemia Mild hyperkalemia with potassium of 5.6 likely secondary to AKI. Patient received 1 dose of Lokelma Repeat potassium level within normal limits    Rectal bleeding Had a small amount of bleeding with straining and constipation. Rectal exam done by EDP was negative for any bleeding. Aspirin is on hold due to drop in H/H Monitor H&H closely   Constipation Likely secondary to opioid use. Per patient he was quite regular before that. - Bowel regimen   Permanent atrial fibrillation (HCC) Rate controlled.  Not on any medication or anticoagulation at  home. Per patient he was taking Eliquis  long time ago and was stopped taking it for about a year after discussing with cardiologist as he does not want to take anymore blood thinners.     Thrombocytopenia Most likely related to acute febrile illness Monitor closely for bleeding.       Subjective: Continues to spike fevers.  Son at the bedside.  Physical Exam: Vitals:   07/26/24 0628 07/26/24  0755 07/26/24 1148 07/26/24 1539  BP: 105/63 (!) 93/57 (!) 99/52 (!) 96/57  Pulse: 74 (!) 101 84 98  Resp: 19 16 17 16   Temp: (!) 100.7 F (38.2 C) (!) 101 F (38.3 C) (!) 100.9 F (38.3 C)   TempSrc: Rectal Rectal Rectal   SpO2: 100% 98% 100% 97%  Weight:      Height:       General.  Frail and hard of hearing elderly man, acutely ill-appearing Pulmonary.  Lungs clear bilaterally, normal respiratory effort. CV.  Regular rate and rhythm, no JVD, rub or murmur. Abdomen.  Soft, nontender, nondistended, BS positive. CNS.  Alert and oriented .  No focal neurologic deficit. Extremities.  No edema, no cyanosis, pulses intact and symmetrical.     Data Reviewed: White count 11.0, hemoglobin 8.9, hematocrit 26.4, platelet count 160, BUN 28, creatinine 1.19, lactic acid 1.9 Labs reviewed  Family Communication: Plan of care discussed was discussed with patient and his son at the bedside.  All questions and concerns have been addressed.  CODE STATUS was discussed and patient wishes to be a DO NOT RESUSCITATE.  Disposition: Status is: Inpatient Remains inpatient appropriate because: On IV antibiotics  Planned Discharge Destination: TBD    Time spent: 50 minutes  Author: Aimee Somerset, MD 07/26/2024 4:03 PM  For on call review [email or web address].

## 2024-07-26 NOTE — Plan of Care (Signed)
   Problem: Health Behavior/Discharge Planning: Goal: Ability to manage health-related needs will improve Outcome: Progressing   Problem: Clinical Measurements: Goal: Ability to maintain clinical measurements within normal limits will improve Outcome: Progressing

## 2024-07-27 ENCOUNTER — Inpatient Hospital Stay

## 2024-07-27 DIAGNOSIS — N179 Acute kidney failure, unspecified: Secondary | ICD-10-CM | POA: Diagnosis not present

## 2024-07-27 MED ORDER — ACETAMINOPHEN 160 MG/5ML PO SOLN
650.0000 mg | Freq: Four times a day (QID) | ORAL | Status: DC | PRN
  Administered 2024-07-27 – 2024-07-28 (×4): 650 mg via ORAL
  Filled 2024-07-27 (×5): qty 20.3

## 2024-07-27 NOTE — Plan of Care (Signed)
  Problem: Nutrition: Goal: Adequate nutrition will be maintained 07/27/2024 0522 by Devona Lonell CROME, RN Outcome: Progressing 07/27/2024 0522 by Devona Lonell CROME, RN Reactivated   Problem: Elimination: Goal: Will not experience complications related to bowel motility Reactivated   Problem: Elimination: Goal: Will not experience complications related to urinary retention Reactivated   Problem: Pain Managment: Goal: General experience of comfort will improve and/or be controlled Reactivated

## 2024-07-27 NOTE — Plan of Care (Signed)
  Problem: Pain Managment: Goal: General experience of comfort will improve and/or be controlled Outcome: Progressing   Problem: Elimination: Goal: Will not experience complications related to bowel motility Outcome: Progressing   Problem: Clinical Measurements: Goal: Ability to maintain clinical measurements within normal limits will improve Outcome: Progressing

## 2024-07-27 NOTE — Progress Notes (Signed)
 Pharmacy Antibiotic Note  Gary Lane is a 89 y.o. male w/ PMH of BPH, atrial fibrillation admitted on 07/23/2024 with sepsis.  Pharmacy has been consulted for vancomycin and Zosyn dosing.  Plan:  1) continue Zosyn 3.375g IV q8h (4 hour infusion).  2) continue vancomycin 1000 mg IV every 24 hours Goal AUC 400-550. Expected AUC: 457.4 SCr used: 1.19 mg/dL   Height: 6' (817.0 cm) Weight: 74.8 kg (164 lb 14.5 oz) IBW/kg (Calculated) : 77.6  Temp (24hrs), Avg:100.7 F (38.2 C), Min:98.5 F (36.9 C), Max:101.7 F (38.7 C)  Recent Labs  Lab 07/23/24 1203 07/24/24 0506 07/25/24 0827 07/25/24 1337 07/25/24 1549 07/26/24 0911  WBC 8.5 7.6 10.1  --   --  11.0*  CREATININE 1.92* 1.51* 1.28*  --   --  1.19  LATICACIDVEN  --   --   --  2.3* 2.2* 1.9    Estimated Creatinine Clearance: 41.9 mL/min (by C-G formula based on SCr of 1.19 mg/dL).    No Known Allergies  Antimicrobials this admission: 10/22 vancomycin >> 10/22 Zosyn >>   Microbiology results: 10/22 BCx: NGTD 10/23 BCx: NGTD  Thank you for allowing pharmacy to be a part of this patient's care.  Gary Lane 07/27/2024 7:19 AM

## 2024-07-27 NOTE — Progress Notes (Signed)
 Occupational Therapy Treatment Patient Details Name: Gary Lane MRN: [id number] DOB: May 18, 1930 Today's Date: 07/27/2024   History of present illness Pt is a 89 y/o M presenting to ED with c/o rectal bleeding and constipation. MD assessment includes AKI, acute urinary retention, and hyperkalemia. PMH significant for paroxysmal afib, prior cholecystectomy and prostate procedure.   OT comments  Pt participates in OT treatment session, limited tolerance to standing ADL performance and demonstrates increased dyspnea upon exertion with HR and SpO2 WNL on RA. CGA to exit bed, MIN A to perform STS using RW, and stands for use of mouthwash at sink. Requires cues for activity pacing and to return to seated for recovery break with increased WOB. Pt performs remainder of brushing teeth with MIN A seated. Discharge recommendation appropriate, OT will follow acutely.       If plan is discharge home, recommend the following:  A lot of help with walking and/or transfers;A lot of help with bathing/dressing/bathroom;Assistance with cooking/housework;Help with stairs or ramp for entrance;Assist for transportation   Equipment Recommendations  Other (comment)       Precautions / Restrictions Precautions Precautions: Fall Recall of Precautions/Restrictions: Impaired Restrictions Weight Bearing Restrictions Per Provider Order: No       Mobility Bed Mobility Overal bed mobility: Needs Assistance Bed Mobility: Supine to Sit, Sit to Supine     Supine to sit: Contact guard, HOB elevated, Used rails Sit to supine: Min assist        Transfers Overall transfer level: Needs assistance Equipment used: Rolling walker (2 wheels) Transfers: Sit to/from Stand Sit to Stand: Min assist           General transfer comment: very effortful for pt, requires foot block, pt's feet sliding out from under him during transition to seated, modA to control descent and prevent fall     Balance Overall  balance assessment: Needs assistance Sitting-balance support: Feet supported Sitting balance-Leahy Scale: Fair     Standing balance support: Bilateral upper extremity supported, During functional activity, Reliant on assistive device for balance Standing balance-Leahy Scale: Fair Standing balance comment: Heavy BUE support on RW                           ADL either performed or assessed with clinical judgement   ADL Overall ADL's : Needs assistance/impaired     Grooming: Wash/dry hands;Wash/dry face;Oral care;Sitting;Standing;Minimal assistance Grooming Details (indicate cue type and reason): pt uses pre-brush mouthwash (personal request) to swish in standing. pt with increased SOB upon standing exertion, cues for returning to seated to finish oral care. pt fatigues quickly                             Functional mobility during ADLs: Minimal assistance;Cueing for safety;Cueing for sequencing;Rolling walker (2 wheels) General ADL Comments: requires cues for activity pacing, poor standing tolerance    Extremity/Trunk Assessment              Vision       Perception     Praxis     Communication Communication Communication: Impaired Factors Affecting Communication: Hearing impaired   Cognition Arousal: Alert Behavior During Therapy: WFL for tasks assessed/performed Cognition: No apparent impairments                               Following commands: Intact  Cueing   Cueing Techniques: Verbal cues, Visual cues        General Comments SpO2 99 on RA, HR 100    Pertinent Vitals/ Pain       Pain Assessment Pain Assessment: No/denies pain         Frequency  Min 2X/week        Progress Toward Goals  OT Goals(current goals can now be found in the care plan section)  Progress towards OT goals: Progressing toward goals  Acute Rehab OT Goals OT Goal Formulation: With patient Time For Goal Achievement:  08/07/24 Potential to Achieve Goals: Fair ADL Goals Pt Will Perform Grooming: with supervision Pt Will Perform Lower Body Dressing: with supervision Pt Will Transfer to Toilet: with supervision Pt Will Perform Toileting - Clothing Manipulation and hygiene: with supervision  Plan      Co-evaluation                 AM-PAC OT 6 Clicks Daily Activity     Outcome Measure   Help from another person eating meals?: None Help from another person taking care of personal grooming?: A Little Help from another person toileting, which includes using toliet, bedpan, or urinal?: A Lot Help from another person bathing (including washing, rinsing, drying)?: A Lot Help from another person to put on and taking off regular upper body clothing?: A Little Help from another person to put on and taking off regular lower body clothing?: A Lot 6 Click Score: 16    End of Session Equipment Utilized During Treatment: Rolling walker (2 wheels)  OT Visit Diagnosis: Unsteadiness on feet (R26.81);Repeated falls (R29.6);Muscle weakness (generalized) (M62.81)   Activity Tolerance Patient limited by fatigue   Patient Left in bed;with call bell/phone within reach;with bed alarm set   Nurse Communication Mobility status        Time: 8475-8459 OT Time Calculation (min): 16 min  Charges: OT General Charges $OT Visit: 1 Visit OT Treatments $Self Care/Home Management : 8-22 mins  Dorothea Yow L. Cerise Lieber, OTR/L  07/27/24, 3:47 PM

## 2024-07-27 NOTE — Progress Notes (Signed)
  Progress Note   Date: 07/27/2024  Patient Name: Gary Lane        MRN#: [id number]   Clarification of diagnosis:   Severe Sepsis (Sepsis with organ dysfunction)

## 2024-07-27 NOTE — Progress Notes (Signed)
 Progress Note   Patient: Gary Lane FMW:[id number] DOB: [birth year] DOA: 07/23/2024     1 DOS: the patient was seen and examined on 07/27/2024   Brief hospital course:  Gary Lane is a 89 y.o. male with medical history significant of paroxysmal atrial fibrillation not on any anticoagulation, prior cholecystectomy and a prostate procedure, came to ED with concern of having small amount of bleeding per rectum when he was trying to strain due to constipation.   Per patient he had a fall when he slipped out of bed and unable to get up himself 2 weeks ago, no reported injuries as imaging was obtained by PCP.  Due to aches and pains he was given opioid by PCP.  Patient also has a recent dental infection for which he just completed a course of Augmentin.   Patient developed constipation, poor p.o. intake, he started taking some laxative but only had small amount of watery bowel movement, due to excessive straining he saw some bleeding per rectum which prompted him to come to ED.   Patient denies any abdominal pain.  Was not urinating well since yesterday and also need to strain with urination for 1 day. Denies any dysuria or hematuria.   Patient denies any other recent illnesses, no upper respiratory symptoms.  No chest pain or shortness of breath.  No fever or chills.   ED course and data reviewed.  On arrival hemodynamically stable, CBC seems stable, does have significant macrocytosis, labs with mild hyponatremia with sodium at 134, hyperkalemia with potassium of 5.6, CO2 21, BUN 43, creatinine 1.92 with baseline around 1.1, AST 112, lipase 56. EKG with atrial fibrillation.   CT head and cervical spine was negative for any acute abnormality, did show age-related cerebral atrophy, ventriculomegaly and periventricular white matter disease.   CT abdomen and pelvis with no acute abnormality.  Post cholecystectomy changes.  Distended bladder.   Patient was given 1 L of bolus, Lokelma and  enema ordered.        Assessment and Plan:  Sepsis Unclear etiology Noted to have chills during rounds on 10/22 Continues to spike fever with a T max of 100.9 Lactic acid was elevated but shows a downward trend Procalcitonin level was elevated Blood cultures showed no growth in 2 days Continue IV fluid hydration Continue empiric antibiotic therapy with vancomycin and Zosyn # 3     * AKI (acute kidney injury) Likely secondary to  dehydration and urinary retention.   Creatinine at 1.92 with baseline around 1.1.   Renal function has improved with IV fluid hydration and is back to baseline 1.92 >> 1.28 >. 1.19 Continue gentle IV fluid hydration     Acute urinary retention Patient has a history of prostate procedure but does not take any Flomax at home. Recent use of pain medication and constipation may be contributory. Postvoid bladder volume more than 300 mL Foley catheter placed with drainage of 500 mL of urine Continue Flomax     Hyperkalemia Mild hyperkalemia with potassium of 5.6 likely secondary to AKI. Patient received 1 dose of Lokelma Repeat potassium level within normal limits     Rectal bleeding Had a small amount of bleeding with straining and constipation. Rectal exam done by EDP was negative for any bleeding. Aspirin is on hold due to drop in H/H Monitor H&H closely   Constipation Likely secondary to opioid use. Per patient he was quite regular before that. - Bowel regimen   Permanent atrial fibrillation (HCC) Rate  controlled.  Not on any medication or anticoagulation at home. Per patient he was taking Eliquis  long time ago and was stopped taking it for about a year after discussing with cardiologist as he does not want to take anymore blood thinners.     Thrombocytopenia Most likely related to acute febrile illness Monitor closely for bleeding.           Subjective: No new complaints  Physical Exam: Vitals:   07/26/24 2047 07/27/24 0126  07/27/24 0449 07/27/24 0709  BP:  107/65 105/70 115/74  Pulse:  92 98 93  Resp:  (!) 28 (!) 24 18  Temp:  (!) 100.9 F (38.3 C) 98.5 F (36.9 C) 98.4 F (36.9 C)  TempSrc: Rectal Rectal Rectal Rectal  SpO2:  98% 100% 100%  Weight:      Height:       General.  Frail and hard of hearing elderly man, acutely ill-appearing Pulmonary.  Lungs clear bilaterally, normal respiratory effort. CV.  Regular rate and rhythm, no JVD, rub or murmur. Abdomen.  Soft, nontender, nondistended, BS positive. CNS.  Alert and oriented x 3.  No focal neurologic deficit. Extremities.  No edema, no cyanosis, pulses intact and symmetrical.      Data Reviewed:  There are no new results to review at this time.  Family Communication: None  Disposition: Status is: Inpatient Remains inpatient appropriate because: Continues to require IV antibiotics  Planned Discharge Destination: Skilled nursing facility    Time spent: 50 minutes  Author: Aimee Somerset, MD 07/27/2024 12:31 PM  For on call review [email or web address].

## 2024-07-27 NOTE — TOC Progression Note (Signed)
 Transition of Care [facility]) - Progression Note    Patient Details  Name: MEILECH VIRTS MRN: [id number] Date of Birth: January 08, 1930  [facility] CM/SW Contact  Alfonso Rummer, LCSW Phone Number: 07/27/2024, 2:02 PM  Clinical Narrative:    [name] Rummer met with pt in room 225.Pt reports he is feeling better and looking forward to return home. Pt reports he does not have any questions/concerns and inquires about a discharge date. LCSW A Celia Gibbons advise his attending physician will further advise on his discharge date. LCSW A Rummer unable to speak with pt son via phone.                      Expected Discharge Plan and Services         Expected Discharge Date: 07/24/24                                     Social Drivers of Health (SDOH) Interventions SDOH Screenings   Food Insecurity: No Food Insecurity (07/23/2024)  Housing: Patient Declined (07/23/2024)  Transportation Needs: No Transportation Needs (07/23/2024)  Utilities: Not At Risk (07/23/2024)  Financial Resource Strain: Low Risk  (08/24/2023)   Received from [facility]  Social Connections: Patient Declined (07/23/2024)  Tobacco Use: Low Risk  (07/23/2024)    Readmission Risk Interventions     No data to display

## 2024-07-27 NOTE — Progress Notes (Signed)
 Physical Therapy Treatment Patient Details Name: Gary Lane MRN: [id number] DOB: [birth year] Today's Date: 07/27/2024   History of Present Illness Pt is a 89 y/o M presenting to ED with c/o rectal bleeding and constipation. MD assessment includes AKI, acute urinary retention, and hyperkalemia. PMH significant for paroxysmal afib, prior cholecystectomy and prostate procedure.    PT Comments  Pt was supine (HOB elevated ~ 20 degrees),  alert and O x 3. Agrees to session and remains cooperative and pleasant. Does require more assistance to exit bed, stand, and ambulate a short distance today with use of RW. SOPB noted but recovers quickly. Called pt's son post session to discuss needs at DC. MD made aware of DME recs. Acute PT will continue to follow and progress per current POC.    If plan is discharge home, recommend the following: A little help with walking and/or transfers;A little help with bathing/dressing/bathroom;Assistance with cooking/housework;Direct supervision/assist for medications management;Assist for transportation   Can travel by private vehicle     Yes  Equipment Recommendations  Wheelchair cushion (measurements PT);Wheelchair (measurements PT);BSC/3in1;Rolling walker (2 wheels)       Precautions / Restrictions Precautions Precautions: Fall Recall of Precautions/Restrictions: Impaired Restrictions Weight Bearing Restrictions Per Provider Order: No     Mobility  Bed Mobility Overal bed mobility: Needs Assistance Bed Mobility: Supine to Sit, Sit to Supine  Supine to sit: Contact guard, HOB elevated, Used rails Sit to supine: Min assist     Transfers Overall transfer level: Needs assistance Equipment used: Rolling walker (2 wheels) Transfers: Sit to/from Stand Sit to Stand: Min assist     Ambulation/Gait Ambulation/Gait assistance: Editor, commissioning (Feet): 30 Feet Assistive device: Rolling walker (2 wheels) Gait Pattern/deviations: Step-through  pattern Gait velocity: decreased  General Gait Details: Pt ambulated to doorway of room and back with us  eof RW + min assist. Narrow BOS and slight scissoring with turns    Balance Overall balance assessment: Needs assistance Sitting-balance support: Feet supported Sitting balance-Leahy Scale: Fair     Standing balance support: Bilateral upper extremity supported, During functional activity, Reliant on assistive device for balance Standing balance-Leahy Scale: Fair Standing balance comment: Heavy BUE support on RW       Communication Communication Communication: Impaired  Cognition Arousal: Alert Behavior During Therapy: WFL for tasks assessed/performed   PT - Cognitive impairments: No apparent impairments    PT - Cognition Comments: Pt is A and O x 4 Following commands: Intact      Cueing Cueing Techniques: Verbal cues, Visual cues         Pertinent Vitals/Pain Pain Assessment Pain Assessment: No/denies pain     PT Goals (current goals can now be found in the care plan section) Acute Rehab PT Goals Patient Stated Goal: to go home to my sons house at DC Progress towards PT goals: Progressing toward goals    Frequency    Min 2X/week       AM-PAC PT 6 Clicks Mobility   Outcome Measure  Help needed turning from your back to your side while in a flat bed without using bedrails?: A Little Help needed moving from lying on your back to sitting on the side of a flat bed without using bedrails?: A Little Help needed moving to and from a bed to a chair (including a wheelchair)?: A Little Help needed standing up from a chair using your arms (e.g., wheelchair or bedside chair)?: A Lot Help needed to walk in hospital room?: A Lot  Help needed climbing 3-5 steps with a railing? : A Lot 6 Click Score: 15    End of Session Equipment Utilized During Treatment: Gait belt Activity Tolerance: Patient limited by fatigue Patient left: in bed;with call bell/phone within  reach;with bed alarm set Nurse Communication: Mobility status PT Visit Diagnosis: Unsteadiness on feet (R26.81);Muscle weakness (generalized) (M62.81);Other abnormalities of gait and mobility (R26.89);Difficulty in walking, not elsewhere classified (R26.2)     Time: 8668-8651 PT Time Calculation (min) (ACUTE ONLY): 17 min  Charges:    $Therapeutic Activity: 8-22 mins PT General Charges $$ ACUTE PT VISIT: 1 Visit                    Rankin Essex PTA 07/27/24, 2:39 PM

## 2024-07-28 DIAGNOSIS — N179 Acute kidney failure, unspecified: Secondary | ICD-10-CM | POA: Diagnosis not present

## 2024-07-28 LAB — CBC WITH DIFFERENTIAL/PLATELET
Abs Immature Granulocytes: 0.09 K/uL — ABNORMAL HIGH (ref 0.00–0.07)
Basophils Absolute: 0 K/uL (ref 0.0–0.1)
Basophils Relative: 1 %
Eosinophils Absolute: 0.1 K/uL (ref 0.0–0.5)
Eosinophils Relative: 2 %
HCT: 27.8 % — ABNORMAL LOW (ref 39.0–52.0)
Hemoglobin: 9.2 g/dL — ABNORMAL LOW (ref 13.0–17.0)
Immature Granulocytes: 2 %
Lymphocytes Relative: 22 %
Lymphs Abs: 0.9 K/uL (ref 0.7–4.0)
MCH: 38.5 pg — ABNORMAL HIGH (ref 26.0–34.0)
MCHC: 33.1 g/dL (ref 30.0–36.0)
MCV: 116.3 fL — ABNORMAL HIGH (ref 80.0–100.0)
Monocytes Absolute: 0.5 K/uL (ref 0.1–1.0)
Monocytes Relative: 12 %
Neutro Abs: 2.5 K/uL (ref 1.7–7.7)
Neutrophils Relative %: 61 %
Platelets: 127 K/uL — ABNORMAL LOW (ref 150–400)
RBC: 2.39 MIL/uL — ABNORMAL LOW (ref 4.22–5.81)
RDW: 15 % (ref 11.5–15.5)
Smear Review: NORMAL
WBC: 4.1 K/uL (ref 4.0–10.5)
nRBC: 0 % (ref 0.0–0.2)

## 2024-07-28 LAB — BASIC METABOLIC PANEL WITH GFR
Anion gap: 8 (ref 5–15)
BUN: 18 mg/dL (ref 8–23)
CO2: 20 mmol/L — ABNORMAL LOW (ref 22–32)
Calcium: 7.7 mg/dL — ABNORMAL LOW (ref 8.9–10.3)
Chloride: 106 mmol/L (ref 98–111)
Creatinine, Ser: 1.26 mg/dL — ABNORMAL HIGH (ref 0.61–1.24)
GFR, Estimated: 54 mL/min — ABNORMAL LOW (ref >60)
Glucose, Bld: 107 mg/dL — ABNORMAL HIGH (ref 70–99)
Potassium: 3.7 mmol/L (ref 3.5–5.1)
Sodium: 134 mmol/L — ABNORMAL LOW (ref 135–145)

## 2024-07-28 LAB — MRSA NEXT GEN BY PCR, NASAL: MRSA by PCR Next Gen: NOT DETECTED

## 2024-07-28 MED ORDER — ENSURE PLUS HIGH PROTEIN PO LIQD
237.0000 mL | Freq: Two times a day (BID) | ORAL | Status: DC
  Administered 2024-07-28 – 2024-07-30 (×5): 237 mL via ORAL

## 2024-07-28 NOTE — Progress Notes (Signed)
 Progress Note   Patient: Gary Lane FMW:[id number] DOB: [birth year] DOA: 07/23/2024     2 DOS: the patient was seen and examined on 07/28/2024   Brief hospital course:  MIKOLAJ WOOLSTENHULME is a 89 y.o. male with medical history significant of paroxysmal atrial fibrillation not on any anticoagulation, prior cholecystectomy and a prostate procedure, came to ED with concern of having small amount of bleeding per rectum when he was trying to strain due to constipation.   Per patient he had a fall when he slipped out of bed and unable to get up himself 2 weeks ago, no reported injuries as imaging was obtained by PCP.  Due to aches and pains he was given opioid by PCP.  Patient also has a recent dental infection for which he just completed a course of Augmentin.   Patient developed constipation, poor p.o. intake, he started taking some laxative but only had small amount of watery bowel movement, due to excessive straining he saw some bleeding per rectum which prompted him to come to ED.   Patient denies any abdominal pain.  Was not urinating well since yesterday and also need to strain with urination for 1 day. Denies any dysuria or hematuria.   Patient denies any other recent illnesses, no upper respiratory symptoms.  No chest pain or shortness of breath.  No fever or chills.   ED course and data reviewed.  On arrival hemodynamically stable, CBC seems stable, does have significant macrocytosis, labs with mild hyponatremia with sodium at 134, hyperkalemia with potassium of 5.6, CO2 21, BUN 43, creatinine 1.92 with baseline around 1.1, AST 112, lipase 56. EKG with atrial fibrillation.   CT head and cervical spine was negative for any acute abnormality, did show age-related cerebral atrophy, ventriculomegaly and periventricular white matter disease.   CT abdomen and pelvis with no acute abnormality.  Post cholecystectomy changes.  Distended bladder.   Patient was given 1 L of bolus, Lokelma and  enema ordered.        Assessment and Plan:  Sepsis Unclear etiology.  Sterile cultures Noted to have chills during rounds on 10/22 No further episodes of fever Lactic acid was elevated but shows a downward trend Procalcitonin level was elevated Blood cultures showed no growth in 3 days Continue IV fluid hydration Continue empiric antibiotic therapy with vancomycin and Zosyn # 4 to complete a 5-day course of therapy     * AKI (acute kidney injury) Likely secondary to  dehydration and urinary retention.   Creatinine at 1.92 with baseline around 1.1.   Renal function has improved with IV fluid hydration and is back to baseline 1.92 >> 1.28 >. 1.19 Continue gentle IV fluid hydration     Acute urinary retention Patient has a history of prostate procedure but does not take any Flomax at home. Recent use of pain medication and constipation may be contributory. Postvoid bladder volume more than 300 mL Foley catheter placed with drainage of 500 mL of urine Continue Flomax     Hyperkalemia Mild hyperkalemia with potassium of 5.6 likely secondary to AKI. Patient received 1 dose of Lokelma Repeat potassium level within normal limits     Rectal bleeding Had a small amount of bleeding with straining and constipation. Rectal exam done by EDP was negative for any bleeding. Aspirin is on hold due to drop in H/H Monitor H&H closely   Constipation Likely secondary to opioid use. Per patient he was quite regular before that. - Bowel regimen  Permanent atrial fibrillation (HCC) Rate controlled.  Not on any medication or anticoagulation at home. Per patient he was taking Eliquis  long time ago and was stopped taking it for about a year after discussing with cardiologist as he does not want to take anymore blood thinners.     Thrombocytopenia Most likely related to acute febrile illness Monitor closely for bleeding.           Subjective: Appetite is poor.  Physical  Exam: Vitals:   07/27/24 1517 07/27/24 1927 07/28/24 0355 07/28/24 0726  BP: 103/64 122/78 109/63 121/76  Pulse: 71 95 85 92  Resp: 18 (!) 22 20 18   Temp: 98.5 F (36.9 C) (!) 100.7 F (38.2 C) 99.2 F (37.3 C) 99.6 F (37.6 C)  TempSrc: Rectal   Rectal  SpO2: 99% 100% 97% 99%  Weight:      Height:       General.  Frail and hard of hearing elderly man, acutely ill-appearing Pulmonary.  Lungs clear bilaterally, normal respiratory effort. CV.  Regular rate and rhythm, no JVD, rub or murmur. Abdomen.  Soft, nontender, nondistended, BS positive. CNS.  Alert and oriented x 3.  No focal neurologic deficit. Extremities.  No edema, no cyanosis, pulses intact and symmetrical.    Data Reviewed: White count 4.1, hemoglobin 9.2, hematocrit 27.8, sodium 134 Labs reviewed  Family Communication: Plan of care discussed with patient at the bedside.  He verbalizes understanding and agrees with the plan.  Disposition: Status is: Inpatient Remains inpatient appropriate because: Discharge planning  Planned Discharge Destination: Skilled nursing facility    Time spent: 40 minutes  Author: Aimee Somerset, MD 07/28/2024 1:29 PM  For on call review [email or web address].

## 2024-07-28 NOTE — Progress Notes (Signed)
 Mobility Specialist Progress Note:    07/28/24 1100  Mobility  Activity Refused and notified nurse if applicable   Pt kindly refused d/t fatigue. Will attempt at a later time as appropriate. All needs met.  Sherrilee Ditty Mobility Specialist Please contact via Special Educational Needs Teacher or  Rehab office at [phone]

## 2024-07-28 NOTE — Plan of Care (Signed)
  Problem: Pain Managment: Goal: General experience of comfort will improve and/or be controlled Outcome: Progressing   Problem: Elimination: Goal: Will not experience complications related to bowel motility Outcome: Progressing   Problem: Nutrition: Goal: Adequate nutrition will be maintained Outcome: Progressing   Problem: Clinical Measurements: Goal: Ability to maintain clinical measurements within normal limits will improve Outcome: Progressing

## 2024-07-29 DIAGNOSIS — N179 Acute kidney failure, unspecified: Secondary | ICD-10-CM | POA: Diagnosis not present

## 2024-07-29 DIAGNOSIS — D61818 Other pancytopenia: Secondary | ICD-10-CM | POA: Clinically undetermined

## 2024-07-29 LAB — CBC
HCT: 27.5 % — ABNORMAL LOW (ref 39.0–52.0)
Hemoglobin: 9 g/dL — ABNORMAL LOW (ref 13.0–17.0)
MCH: 38.3 pg — ABNORMAL HIGH (ref 26.0–34.0)
MCHC: 32.7 g/dL (ref 30.0–36.0)
MCV: 117 fL — ABNORMAL HIGH (ref 80.0–100.0)
Platelets: 134 K/uL — ABNORMAL LOW (ref 150–400)
RBC: 2.35 MIL/uL — ABNORMAL LOW (ref 4.22–5.81)
RDW: 14.7 % (ref 11.5–15.5)
WBC: 3.6 K/uL — ABNORMAL LOW (ref 4.0–10.5)
nRBC: 0 % (ref 0.0–0.2)

## 2024-07-29 LAB — BASIC METABOLIC PANEL WITH GFR
Anion gap: 10 (ref 5–15)
BUN: 17 mg/dL (ref 8–23)
CO2: 23 mmol/L (ref 22–32)
Calcium: 8 mg/dL — ABNORMAL LOW (ref 8.9–10.3)
Chloride: 106 mmol/L (ref 98–111)
Creatinine, Ser: 1.11 mg/dL (ref 0.61–1.24)
GFR, Estimated: >60 mL/min (ref >60)
Glucose, Bld: 86 mg/dL (ref 70–99)
Potassium: 3.9 mmol/L (ref 3.5–5.1)
Sodium: 139 mmol/L (ref 135–145)

## 2024-07-29 MED ORDER — ASPIRIN 81 MG PO TBEC
81.0000 mg | DELAYED_RELEASE_TABLET | Freq: Every day | ORAL | Status: DC
  Administered 2024-07-29 – 2024-07-30 (×2): 81 mg via ORAL
  Filled 2024-07-29 (×2): qty 1

## 2024-07-29 NOTE — Progress Notes (Signed)
 Mobility Specialist Progress Note:    07/29/24 9078  Mobility  Activity Pivoted/transferred from chair to bed;Ambulated with assistance  Level of Assistance Moderate assist, patient does 50-74%  Assistive Device Front wheel walker  Distance Ambulated (ft) 5 ft  Range of Motion/Exercises Active;All extremities  Activity Response Tolerated well  Mobility visit 1 Mobility  Mobility Specialist Start Time (ACUTE ONLY) 0900  Mobility Specialist Stop Time (ACUTE ONLY) [id number]  Mobility Specialist Time Calculation (min) (ACUTE ONLY) 17 min   NT requested assistance, pt requesting to get back in bed and in [facility]. Required Min/ModA to stand and ambulate with RW. Tolerated well, pt marched in place during transfer and able to take lateral steps as well. Left sitting up for breakfast, alarm on,. Nurse in room, all needs met.  Sherrilee Ditty Mobility Specialist Please contact via Special Educational Needs Teacher or  Rehab office at [phone]

## 2024-07-29 NOTE — Plan of Care (Signed)
  Problem: Elimination: Goal: Will not experience complications related to bowel motility Outcome: Progressing   Problem: Clinical Measurements: Goal: Ability to maintain clinical measurements within normal limits will improve Outcome: Progressing   Problem: Nutrition: Goal: Adequate nutrition will be maintained Outcome: Progressing

## 2024-07-29 NOTE — Progress Notes (Signed)
 Mobility Specialist Progress Note:    07/29/24 0838  Mobility  Activity Stood at bedside;Pivoted/transferred from bed to chair  Level of Assistance Minimal assist, patient does 75% or more  Assistive Device Front wheel walker  Distance Ambulated (ft) 3 ft  Range of Motion/Exercises Active;All extremities  Activity Response Tolerated well  Mobility visit 1 Mobility  Mobility Specialist Start Time (ACUTE ONLY) 0813  Mobility Specialist Stop Time (ACUTE ONLY) 0831  Mobility Specialist Time Calculation (min) (ACUTE ONLY) 18 min   Pt received in bed, agreeable to mobility. Required MinA to stand with bed elevated and RW. Tolerated well, c/o fatigue and soreness all over body. Set pt up for oral care as well. Left in chair, alarm on and belongings in reach. All needs met.  Sherrilee Ditty Mobility Specialist Please contact via Special Educational Needs Teacher or  Rehab office at [phone]

## 2024-07-29 NOTE — Progress Notes (Addendum)
 Progress Note   Patient: Gary Lane FMW:[id number] DOB: Mar 26, 1930 DOA: 07/23/2024     3 DOS: the patient was seen and examined on 07/29/2024   Brief hospital course:  Gary Lane is a 89 y.o. male with medical history significant of paroxysmal atrial fibrillation not on any anticoagulation, prior cholecystectomy and a prostate procedure, came to ED with concern of having small amount of bleeding per rectum when he was trying to strain due to constipation.   Per patient he had a fall when he slipped out of bed and unable to get up himself 2 weeks ago, no reported injuries as imaging was obtained by PCP.  Due to aches and pains he was given opioid by PCP.  Patient also has a recent dental infection for which he just completed a course of Augmentin.   Patient developed constipation, poor p.o. intake, he started taking some laxative but only had small amount of watery bowel movement, due to excessive straining he saw some bleeding per rectum which prompted him to come to ED.   Patient denies any abdominal pain.  Was not urinating well since yesterday and also need to strain with urination for 1 day. Denies any dysuria or hematuria.   Patient denies any other recent illnesses, no upper respiratory symptoms.  No chest pain or shortness of breath.  No fever or chills.   ED course and data reviewed.  On arrival hemodynamically stable, CBC seems stable, does have significant macrocytosis, labs with mild hyponatremia with sodium at 134, hyperkalemia with potassium of 5.6, CO2 21, BUN 43, creatinine 1.92 with baseline around 1.1, AST 112, lipase 56. EKG with atrial fibrillation.   CT head and cervical spine was negative for any acute abnormality, did show age-related cerebral atrophy, ventriculomegaly and periventricular white matter disease.   CT abdomen and pelvis with no acute abnormality.  Post cholecystectomy changes.  Distended bladder.   Patient was given 1 L of bolus, Lokelma and  enema ordered.          Assessment and Plan:  Sepsis Resolved Unclear etiology.  Sterile cultures Noted to have chills during rounds on 10/22 No further episodes of fever in the last 24 hours Lactic acid was elevated but shows a downward trend Procalcitonin level was elevated Blood cultures showed no growth in 3 days Continue IV fluid hydration Patient completed a 5-day course of IV antibiotic therapy with vancomycin and Zosyn.      * AKI (acute kidney injury) Likely secondary to  dehydration and urinary retention.   Creatinine at 1.92 with baseline around 1.1.   Renal function has improved with IV fluid hydration and is back to baseline 1.92 >> 1.28 >>1.19 >>1.11 Resolved     Acute urinary retention Patient has a history of prostate procedure but does not take any Flomax at home. Recent use of pain medication and constipation may be contributory. Postvoid bladder volume more than 300 mL Foley catheter placed with drainage of 500 mL of urine Continue Flomax     Hyperkalemia Mild hyperkalemia with potassium of 5.6 likely secondary to AKI. Patient received 1 dose of Lokelma Repeat potassium level within normal limits     Rectal bleeding Had a small amount of bleeding with straining and constipation. Rectal exam done by EDP was negative for any bleeding. Aspirin will be resumed since H&H is stable   Constipation Likely secondary to opioid use. Per patient he was quite regular before that. - Bowel regimen   Permanent atrial fibrillation (  HCC) Rate controlled.  Not on any medication or anticoagulation at home. Per patient he was taking Eliquis  long time ago and was stopped taking it for about a year after discussing with cardiologist as he does not want to take anymore blood thinners.     Pancytopenia Most likely related to acute febrile illness Monitor closely for bleeding.     Urinary retention Patient with complaints of incomplete emptying and frequency  of urination Bladder scan yielded about 500 mL of urine Foley catheter inserted Patient will likely require discharge with Foley to follow-up with urology as an outpatient Continue Flomax     Subjective: Very weak.  Sitting up in a recliner  Physical Exam: Vitals:   07/28/24 1606 07/28/24 2022 07/29/24 0340 07/29/24 0819  BP: 106/61 116/72 120/72 125/78  Pulse: 84 88 99 96  Resp: 20 20 19 18   Temp: 98.6 F (37 C) 99.2 F (37.3 C) 99.6 F (37.6 C) 97.8 F (36.6 C)  TempSrc: Rectal     SpO2: 99% 100% 97% 100%  Weight:      Height:       General.  Frail and hard of hearing elderly man, acutely ill-appearing Pulmonary.  Lungs clear bilaterally, normal respiratory effort. CV.  Regular rate and rhythm, no JVD, rub or murmur. Abdomen.  Soft, nontender, nondistended, BS positive. CNS.  Alert and oriented x 3.  No focal neurologic deficit. Extremities.  No edema, no cyanosis, pulses intact and symmetrical.       Data Reviewed: Labs reviewed.  Within normal limits Labs reviewed  Family Communication: Plan of care discussed with patient at the bedside.  He verbalizes understanding and agrees to the plan  Disposition: Status is: Inpatient Remains inpatient appropriate because: Discharge planning  Planned Discharge Destination: Skilled nursing facility    Time spent: 35  minutes  Author: Aimee Somerset, MD 07/29/2024 12:14 PM  For on call review [email or web address]

## 2024-07-30 ENCOUNTER — Other Ambulatory Visit: Payer: Self-pay

## 2024-07-30 DIAGNOSIS — N179 Acute kidney failure, unspecified: Secondary | ICD-10-CM | POA: Diagnosis not present

## 2024-07-30 DIAGNOSIS — K5909 Other constipation: Secondary | ICD-10-CM | POA: Diagnosis not present

## 2024-07-30 DIAGNOSIS — E875 Hyperkalemia: Secondary | ICD-10-CM | POA: Diagnosis not present

## 2024-07-30 DIAGNOSIS — K625 Hemorrhage of anus and rectum: Secondary | ICD-10-CM | POA: Diagnosis not present

## 2024-07-30 LAB — CULTURE, BLOOD (ROUTINE X 2)
Culture: NO GROWTH
Culture: NO GROWTH

## 2024-07-30 LAB — CREATININE, SERUM
Creatinine, Ser: 1.08 mg/dL (ref 0.61–1.24)
GFR, Estimated: >60 mL/min (ref >60)

## 2024-07-30 MED ORDER — MIDODRINE HCL 10 MG PO TABS
10.0000 mg | ORAL_TABLET | Freq: Three times a day (TID) | ORAL | 0 refills | Status: AC
  Filled 2024-07-30: qty 90, 30d supply, fill #0

## 2024-07-30 MED ORDER — SUCRALFATE 1 GM/10ML PO SUSP: 1.0000 g | Freq: Three times a day (TID) | ORAL | Status: DC

## 2024-07-30 MED ORDER — SUCRALFATE 1 GM/10ML PO SUSP
1.0000 g | Freq: Three times a day (TID) | ORAL | 0 refills | Status: DC
  Filled 2024-07-30: qty 420, 11d supply, fill #0

## 2024-07-30 MED ORDER — ENSURE PLUS HIGH PROTEIN PO LIQD
237.0000 mL | Freq: Two times a day (BID) | ORAL | 2 refills | Status: AC
  Filled 2024-07-30: qty 20000, 42d supply, fill #0

## 2024-07-30 MED ORDER — LACTULOSE 10 GM/15ML PO SOLN
30.0000 g | Freq: Two times a day (BID) | ORAL | 0 refills | Status: DC | PRN
  Filled 2024-07-30: qty 236, 3d supply, fill #0

## 2024-07-30 NOTE — TOC Progression Note (Signed)
 Transition of Care [facility]) - Progression Note    Patient Details  Name: Gary Lane MRN: [id number] Date of Birth: Jul 27, 1930  Transition of Care [facility]) CM/SW Contact  Alfonso Rummer, LCSW Phone Number: 07/30/2024, 1:11 PM  Clinical Narrative:     [name] Rummer spk with patient and son regarding discharge planning. Pt is opting to go home and declines skilled nursing placement. Home health services is arranged with [facility] for PT, OT and RN.                     Expected Discharge Plan and Services    [facility] home [facility]     Expected Discharge Date: 07/24/24                                     Social Drivers of Health (SDOH) Interventions SDOH Screenings   Food Insecurity: No Food Insecurity (07/23/2024)  Housing: Patient Declined (07/23/2024)  Transportation Needs: No Transportation Needs (07/23/2024)  Utilities: Not At Risk (07/23/2024)  Financial Resource Strain: Low Risk  (08/24/2023)   Received from [facility]  Social Connections: Patient Declined (07/23/2024)  Tobacco Use: Low Risk  (07/23/2024)    Readmission Risk Interventions     No data to display

## 2024-07-30 NOTE — Care Management Important Message (Signed)
 Important Message  Patient Details  Name: Gary Lane MRN: [id number] Date of Birth: 1930-09-26   Important Message Given:  Yes - Medicare IM     Gary Lane 07/30/2024, 4:42 PM

## 2024-07-30 NOTE — Plan of Care (Signed)
  Problem: Nutrition: Goal: Adequate nutrition will be maintained Outcome: Progressing   Problem: Elimination: Goal: Will not experience complications related to bowel motility Outcome: Progressing Goal: Will not experience complications related to urinary retention Outcome: Progressing   Problem: Pain Managment: Goal: General experience of comfort will improve and/or be controlled Outcome: Progressing

## 2024-07-30 NOTE — TOC CM/SW Note (Signed)
    Durable Medical Equipment  (From admission, onward)           Start     Ordered   07/30/24 1342  For home use only DME lightweight manual wheelchair with seat cushion  Once       Comments: Patient suffers from generalized weakness which impairs their ability to perform daily activities like bathing, dressing, grooming, and toileting in the home.  A walker will not resolve  issue with performing activities of daily living. A wheelchair will allow patient to safely perform daily activities. Patient is not able to propel themselves in the home using a standard weight wheelchair due to general weakness. Patient can self propel in the lightweight wheelchair. Length of need Lifetime. Accessories: elevating leg rests (ELRs), wheel locks, extensions and anti-tippers.   07/30/24 1341   07/30/24 1342  For home use only DME Bedside commode  Once       Question:  Patient needs a bedside commode to treat with the following condition  Answer:  Generalized weakness   07/30/24 1341              Patient is not able to walk the distance required to go the bathroom, or he/she is unable to safely negotiate stairs required to access the bathroom.  A 3in1 BSC will alleviate this problem

## 2024-07-30 NOTE — Progress Notes (Signed)
 Physical Therapy Treatment Patient Details Name: Gary Lane MRN: [id number] DOB: 08-08-1930 Today's Date: 07/30/2024   History of Present Illness Pt is a 89 y/o M presenting to ED with c/o rectal bleeding and constipation. MD assessment includes AKI, acute urinary retention, and hyperkalemia. PMH significant for paroxysmal afib, prior cholecystectomy and prostate procedure.    PT Comments  Patient sitting up in chair on arrival to room. He continues to be fatigued with minimal activity. Mod A required for transfers. Patient did walk 62ft with rolling walker with steadying assistance provided. He has generalized weakness and will need caregiver assistance after this hospital stay. PT will continue to follow. Patient hopeful to return home with son soon.    If plan is discharge home, recommend the following: A little help with walking and/or transfers;A little help with bathing/dressing/bathroom;Assistance with cooking/housework;Direct supervision/assist for medications management;Assist for transportation   Can travel by private [employer] cushion (measurements PT);Wheelchair (measurements PT);BSC/3in1;Rolling walker (2 wheels)    Recommendations for Other Services       Precautions / Restrictions Precautions Precautions: Fall Recall of Precautions/Restrictions: Impaired Restrictions Weight Bearing Restrictions Per Provider Order: No     Mobility  Bed Mobility               General bed mobility comments: not assessed as patient sitting up on arrival and post session    Transfers Overall transfer level: Needs assistance Equipment used: Rolling walker (2 wheels) Transfers: Sit to/from Stand Sit to Stand: Mod assist           General transfer comment: lifting assistance required for standing. cues for anterior weight shifting and hand placement    Ambulation/Gait Ambulation/Gait assistance: Min assist Gait Distance  (Feet): 44 Feet Assistive device: Rolling walker (2 wheels) Gait Pattern/deviations: Step-through pattern, Trunk flexed Gait velocity: decreased     General Gait Details: patient fatigued with minimal activity. steadying assistance provided. encouraged continued use of rolling walker for safety, energy conservation strategies to use in home setting   Stairs             Wheelchair Mobility     Tilt Bed    Modified Rankin (Stroke Patients Only)       Balance Overall balance assessment: Needs assistance Sitting-balance support: Feet supported Sitting balance-Leahy Scale: Fair     Standing balance support: During functional activity, Single extremity supported Standing balance-Leahy Scale: Poor Standing balance comment: heavy reliance on rolling walker for support in standing. Min A required initially with facilitation for anterior weight shifting                            Communication Communication Communication: Impaired Factors Affecting Communication: Hearing impaired  Cognition Arousal: Alert Behavior During Therapy: WFL for tasks assessed/performed   PT - Cognitive impairments: No apparent impairments                         Following commands: Intact      Cueing Cueing Techniques: Verbal cues, Visual cues  Exercises      General Comments General comments (skin integrity, edema, etc.): HR up to 115 with activity      Pertinent Vitals/Pain Pain Assessment Pain Assessment: No/denies pain    Home Living  Prior Function            PT Goals (current goals can now be found in the care plan section) Acute Rehab PT Goals Patient Stated Goal: to go home to my sons house at DC PT Goal Formulation: With patient Time For Goal Achievement: 08/07/24 Potential to Achieve Goals: Fair Progress towards PT goals: Progressing toward goals    Frequency    Min 2X/week      PT Plan       Co-evaluation              AM-PAC PT 6 Clicks Mobility   Outcome Measure  Help needed turning from your back to your side while in a flat bed without using bedrails?: A Little Help needed moving from lying on your back to sitting on the side of a flat bed without using bedrails?: A Little Help needed moving to and from a bed to a chair (including a wheelchair)?: A Little Help needed standing up from a chair using your arms (e.g., wheelchair or bedside chair)?: A Lot Help needed to walk in hospital room?: A Lot Help needed climbing 3-5 steps with a railing? : A Lot 6 Click Score: 15    End of Session   Activity Tolerance: Patient limited by fatigue Patient left: in chair;with call bell/phone within reach;with chair alarm set   PT Visit Diagnosis: Unsteadiness on feet (R26.81);Muscle weakness (generalized) (M62.81);Other abnormalities of gait and mobility (R26.89);Difficulty in walking, not elsewhere classified (R26.2)     Time: 8887-8875 PT Time Calculation (min) (ACUTE ONLY): 12 min  Charges:    $Therapeutic Activity: 8-22 mins PT General Charges $$ ACUTE PT VISIT: 1 Visit                     Randine Essex, PT, MPT   Randine LULLA Essex 07/30/2024, 12:42 PM

## 2024-07-30 NOTE — Discharge Summary (Signed)
 Physician Discharge Summary   Patient: Gary Lane MRN: [id number] DOB: Feb 23, 1930  Admit date:     07/23/2024  Discharge date: 07/30/24  Discharge Physician: Amaryllis Dare   PCP: Alla Amis, MD   Recommendations at discharge:  Please obtain CBC and BMP on follow-up Please reevaluate the use of midodrine and if blood pressure stabilizes it can be discontinued. Follow-up with primary care provider Follow-up with urology as patient is being discharged with Foley catheter in place secondary to urinary retention with concern of BPH  Discharge Diagnoses: Principal Problem:   AKI (acute kidney injury) Active Problems:   Acute urinary retention   Hyperkalemia   Rectal bleeding   Constipation   Permanent atrial fibrillation (HCC)   Sepsis without acute organ dysfunction (HCC)   Pancytopenia [facility])   Hospital Course: Gary Lane is a 89 y.o. male with medical history significant of paroxysmal atrial fibrillation not on any anticoagulation, prior cholecystectomy and a prostate procedure, came to ED with concern of having small amount of bleeding per rectum when he was trying to strain due to constipation.   Per patient he had a fall when he slipped out of bed and unable to get up himself 2 weeks ago, no reported injuries as imaging was obtained by PCP.  Due to aches and pains he was given opioid by PCP.  Patient also has a recent dental infection for which he just completed a course of Augmentin.   Patient developed constipation, poor p.o. intake, he started taking some laxative but only had small amount of watery bowel movement, due to excessive straining he saw some bleeding per rectum which prompted him to come to ED.  On arrival hemodynamically stable, CBC seems stable, does have significant macrocytosis, labs with mild hyponatremia with sodium at 134, hyperkalemia with potassium of 5.6, CO2 21, BUN 43, creatinine 1.92 with baseline around 1.1, AST 112, lipase 56. EKG  with atrial fibrillation.   CT head and cervical spine was negative for any acute abnormality, did show age-related cerebral atrophy, ventriculomegaly and periventricular white matter disease.   CT abdomen and pelvis with no acute abnormality.  Post cholecystectomy changes.  Distended bladder.   Patient was given 1 L of bolus, Lokelma and enema ordered.   10/21: Vital stable, hemoglobin with some decreased to 10.3, hyperkalemia resolved, creatinine improving now at 1.51.  No further bleeding reported and patient has a good bowel movement.  Patient continued to have significant postvoid volume.  Renal ultrasound with concern of medical renal disease and no hydronephrosis noted.  Patient was started on Flomax and need to have an outpatient follow-up with urology.  Anemia panel with anemia of chronic disease and mild iron deficiency, started on iron supplement.  Patient was also given bowel regimen and he should avoid opioids to prevent further constipation.  11/27: Hemodynamically stable, still feeling weak but does not want to go to SNF as recommended by physical therapy.  Home health services ordered.  Patient was originally discharged on 10/21 but developed some chills before leaving so discharge was held, next morning he became febrile.  He was treated with broad-spectrum antibiotics and has met criteria for sepsis with fever and developed leukocytosis.  No obvious source of infection found, blood cultures remain negative and urine cultures were not sent.  He completed a 5-day course of antibiotic.  Foley catheter was also placed due to persistent urinary retention.  Urology outpatient appointment was made with Dr. Twylla for 08/14/2024 for further evaluation and  management of his urinary retention and BPH.  Patient was instructed to keep himself well hydrated and need to have a close follow-up with his primary care provider and urology for further assistance.   Consultants: None Procedures  performed: None Disposition: Home health Diet recommendation:  Discharge Diet Orders (From admission, onward)     Start     Ordered   07/30/24 0000  Diet - low sodium heart healthy        07/30/24 1333   07/24/24 0000  Diet - low sodium heart healthy        07/24/24 1531           Regular diet DISCHARGE MEDICATION: Allergies as of 07/30/2024   No Known Allergies      Medication List     STOP taking these medications    metoprolol  tartrate 25 MG tablet Commonly known as: LOPRESSOR        TAKE these medications    acetaminophen 500 MG tablet Commonly known as: TYLENOL Take 500 mg by mouth 2 (two) times daily as needed.   aspirin EC 81 MG tablet Take 81 mg by mouth daily.   cyanocobalamin 1000 MCG tablet Take 1,000 mcg by mouth daily.   feeding supplement Liqd Take 237 mLs by mouth 2 (two) times daily between meals.   ferrous sulfate 325 (65 FE) MG tablet Take 1 tablet (325 mg total) by mouth daily with breakfast.   GARLIC PO Take by mouth daily at 8 pm.   GLUCOSAMINE PO Take by mouth daily at 2 am.   lactulose 10 GM/15ML solution Commonly known as: CHRONULAC Take 45 mLs (30 g total) by mouth 2 (two) times daily as needed for mild constipation or moderate constipation.   magnesium gluconate 500 (27 Mg) MG Tabs tablet Commonly known as: MAGONATE Take 500 mg by mouth daily.   midodrine 10 MG tablet Commonly known as: PROAMATINE Take 1 tablet (10 mg total) by mouth 3 (three) times daily with meals.   milk thistle 175 MG tablet Take 175 mg by mouth daily.   Rivaroxaban  15 MG Tabs tablet Commonly known as: Xarelto  Take 1 tablet (15 mg total) by mouth daily with supper.   sucralfate 1 GM/10ML suspension Commonly known as: CARAFATE Take 10 mLs (1 g total) by mouth 4 (four) times daily -  with meals and at bedtime.   tamsulosin 0.4 MG Caps capsule Commonly known as: FLOMAX Take 1 capsule (0.4 mg total) by mouth daily after supper.   traMADol 50  MG tablet Commonly known as: ULTRAM Take 50 mg by mouth every 6 (six) hours as needed for moderate pain (pain score 4-6).   VITAMIN D PO Take by mouth daily at 8 pm.               Durable Medical Equipment  (From admission, onward)           Start     Ordered   07/30/24 1342  For home use only DME lightweight manual wheelchair with seat cushion  Once       Comments: Patient suffers from generalized weakness which impairs their ability to perform daily activities like bathing, dressing, grooming, and toileting in the home.  A walker will not resolve  issue with performing activities of daily living. A wheelchair will allow patient to safely perform daily activities. Patient is not able to propel themselves in the home using a standard weight wheelchair due to general weakness. Patient can self propel in the  lightweight wheelchair. Length of need Lifetime. Accessories: elevating leg rests (ELRs), wheel locks, extensions and anti-tippers.   07/30/24 1341   07/30/24 1342  For home use only DME Bedside commode  Once       Question:  Patient needs a bedside commode to treat with the following condition  Answer:  Generalized weakness   07/30/24 1341            Follow-up Information     Alla Amis, MD. Go on 07/30/2024.   Specialty: Family Medicine Why: Go at 12:15pm. Contact information: [address] [facility] [place] [address] [phone]         Twylla Glendia BROCKS, MD. Go on 08/14/2024.   Specialty: Urology Why: at 9:00 AM Contact information: [address] [place] [address] [phone]                Discharge Exam: Fredricka Weights   07/23/24 1159  Weight: 74.8 kg   General.  Frail elderly man, in no acute distress. Pulmonary.  Lungs clear bilaterally, normal respiratory effort. CV.  Regular rate and rhythm, no JVD, rub or murmur. Abdomen.  Soft, nontender, nondistended, BS positive. CNS.  Alert  and oriented .  No focal neurologic deficit. Extremities.  No edema, no cyanosis, pulses intact and symmetrical.  Condition at discharge: stable  The results of significant diagnostics from this hospitalization (including imaging, microbiology, ancillary and laboratory) are listed below for reference.   Imaging Studies: US  Venous Img Lower Bilateral (DVT) Result Date: 07/27/2024 CLINICAL DATA:  Lower extremity swelling EXAM: Bilateral Lower Extremity Venous Doppler Ultrasound TECHNIQUE: Gray-scale sonography with compression, as well as color and duplex ultrasound, were performed to evaluate the deep venous system(s) from the level of the common femoral vein through the popliteal and proximal calf veins. COMPARISON:  None available FINDINGS: VENOUS Normal compressibility of the common femoral, superficial femoral, and popliteal veins, as well as the visualized calf veins. Visualized portions of profunda femoral vein and great saphenous vein unremarkable. No filling defects to suggest DVT on grayscale or color Doppler imaging. Doppler waveforms show normal direction of venous flow, normal respiratory plasticity and response to augmentation. OTHER None. Limitations: none IMPRESSION: No lower extremity DVT. Electronically Signed   By: Aliene Lloyd M.D.   On: 07/27/2024 10:22   CT HEAD WO CONTRAST ( ) Result Date: 07/26/2024 EXAM: CT HEAD WITHOUT CONTRAST 07/26/2024 06:52:50 PM TECHNIQUE: CT of the head was performed without the administration of intravenous contrast. Automated exposure control, iterative reconstruction, and/or weight based adjustment of the mA/kV was utilized to reduce the radiation dose to as low as reasonably achievable. COMPARISON: CT head 07/23/2024 CLINICAL HISTORY: Neuro deficit, acute, stroke suspected. FINDINGS: BRAIN AND VENTRICLES: No acute hemorrhage. No evidence of acute infarct. Similar patchy white matter hypogenesis, compatible with chronic microvascular ischemic disease.  No hydrocephalus. No extra-axial collection. No mass effect or midline shift. ORBITS: No acute abnormality. SINUSES: No acute abnormality. SOFT TISSUES AND SKULL: No skull fracture. IMPRESSION: 1. No acute intracranial abnormality. Electronically signed by: Gilmore Molt MD 07/26/2024 10:17 PM EDT RP Workstation: [id number]   DG Chest Port 1 View Result Date: 07/25/2024 CLINICAL DATA:  Fever. EXAM: PORTABLE CHEST 1 VIEW COMPARISON:  10/16/2019. FINDINGS: Trachea is midline. Heart is enlarged stable. Thoracic aorta is calcified. Minimal streaky atelectasis in left lung base. No airspace consolidation or pleural fluid. IMPRESSION: Minimal streaky left base atelectasis. Electronically Signed   By: Newell Eke M.D.   On:  07/25/2024 12:35   US  RENAL Result Date: 07/24/2024 CLINICAL DATA:  Acute kidney injury EXAM: RENAL / URINARY TRACT ULTRASOUND COMPLETE COMPARISON:  CT abdomen pelvis 07/23/2024 FINDINGS: Right Kidney: Renal measurements: 9.3 x 4.8 x 4.9 cm = volume: 114 mL. Mild increased echogenicity of the renal cortex without significant thinning. No mass or hydronephrosis visualized. Left Kidney: Renal measurements: 10.4 x 5.6 x 5.6 cm = volume: 178 mL. Mild increased echogenicity of the renal cortex without significant thinning. No mass or hydronephrosis visualized. Bladder: Appears normal for degree of bladder distention. Other: None. IMPRESSION: Mild increased echogenicity of the renal cortices without significant thinning, consistent with chronic medical renal disease. Electronically Signed   By: Aliene Lloyd M.D.   On: 07/24/2024 12:09   CT ABDOMEN PELVIS WO CONTRAST Result Date: 07/23/2024 CLINICAL DATA:  Rectal bleeding since this morning. EXAM: CT ABDOMEN AND PELVIS WITHOUT CONTRAST TECHNIQUE: Multidetector CT imaging of the abdomen and pelvis was performed following the standard protocol without IV contrast. RADIATION DOSE REDUCTION: This exam was performed according to the departmental  dose-optimization program which includes automated exposure control, adjustment of the mA and/or kV according to patient size and/or use of iterative reconstruction technique. COMPARISON:  None Available. FINDINGS: Lower chest: Basilar scarring changes and atelectasis. No infiltrates or effusions. No pulmonary edema. Mild cardiac enlargement but no pericardial effusion. Aortic and coronary artery calcifications. Hepatobiliary: No hepatic lesions are identified without contrast. The gallbladder is surgically absent. No intrahepatic biliary dilatation. Moderate common bile duct dilatation likely related to the patient's age and prior cholecystectomy. Pancreas: No mass, inflammation or ductal dilatation. Spleen: Normal size.  No focal lesions. Adrenals/Urinary Tract: Adrenal glands and kidneys are unremarkable. No renal, ureteral or bladder calculi or obvious mass without contrast. Stomach/Bowel: The stomach, duodenum, small bowel and colon are grossly normal. No mass lesions or obstructive findings. Moderate stool noted in the rectum. No rectal mass. Vascular/Lymphatic: Age related advanced atherosclerotic calcification involving the aorta and branch vessels but no aneurysm. No mesenteric or retroperitoneal mass or adenopathy. Small scattered lymph nodes are noted. Reproductive: The prostate gland is normal in size. The seminal vesicles are normal. Other: No pelvic mass or adenopathy. No free pelvic fluid collections. No inguinal mass or adenopathy. No abdominal wall hernia or subcutaneous lesions. Musculoskeletal: Advanced degenerative lumbar spondylosis no acute bony findings. Both hips are intact. IMPRESSION: 1. No acute abdominal/pelvic findings, mass lesions or adenopathy. 2. Status post cholecystectomy with moderate common bile duct dilatation likely related to the patient's age and prior cholecystectomy. 3. Age related advanced atherosclerotic calcification involving the aorta and branch vessels. 4. Aortic  atherosclerosis. Aortic Atherosclerosis (ICD10-I70.0). Electronically Signed   By: MYRTIS Stammer M.D.   On: 07/23/2024 14:59   CT HEAD WO CONTRAST ( ) Result Date: 07/23/2024 CLINICAL DATA:  Minor head trauma. EXAM: CT HEAD WITHOUT CONTRAST CT CERVICAL SPINE WITHOUT CONTRAST TECHNIQUE: Multidetector CT imaging of the head and cervical spine was performed following the standard protocol without intravenous contrast. Multiplanar CT image reconstructions of the cervical spine were also generated. RADIATION DOSE REDUCTION: This exam was performed according to the departmental dose-optimization program which includes automated exposure control, adjustment of the mA and/or kV according to patient size and/or use of iterative reconstruction technique. COMPARISON:  None Available. FINDINGS: CT HEAD FINDINGS Brain: Age related cerebral atrophy, ventriculomegaly and periventricular white matter disease. No extra-axial fluid collections are identified. No CT findings for acute hemispheric infarction or intracranial hemorrhage. No mass lesions. The brainstem and cerebellum are normal.  Vascular: Scattered vascular calcifications but no aneurysm or hyperdense vessels. Skull: No skull fracture or bone lesions. Sinuses/Orbits: The paranasal sinuses and mastoid air cells are clear. The globes are intact. Other: Evidence of prior/remote nasal bone fractures. No scalp lesions or scalp hematoma. CT CERVICAL SPINE FINDINGS Alignment: Normal overall alignment. Mild multilevel degenerative subluxations most notably at C7. Skull base and vertebrae: No acute fracture. No primary bone lesion or focal pathologic process. Soft tissues and spinal canal: No prevertebral fluid or swelling. No visible canal hematoma. Disc levels: The spinal canal is fairly generous. No significant canal stenosis. Mild multilevel foraminal narrowing due to uncinate spurring and facet disease. Upper chest: The lung apices are grossly clear. Moderate distension  of the cervical esophagus. Other: Atherosclerotic calcifications involving the carotid arteries. No mass, adenopathy or hematoma. IMPRESSION: 1. Age related cerebral atrophy, ventriculomegaly and periventricular white matter disease. 2. No acute intracranial findings or skull fracture. 3. Normal overall alignment of the cervical vertebral bodies and no acute fracture. 4. Multilevel degenerative cervical spondylosis with mild multilevel foraminal narrowing due to uncinate spurring and facet disease. Electronically Signed   By: MYRTIS Stammer M.D.   On: 07/23/2024 14:54   CT Cervical Spine Wo Contrast Result Date: 07/23/2024 CLINICAL DATA:  Minor head trauma. EXAM: CT HEAD WITHOUT CONTRAST CT CERVICAL SPINE WITHOUT CONTRAST TECHNIQUE: Multidetector CT imaging of the head and cervical spine was performed following the standard protocol without intravenous contrast. Multiplanar CT image reconstructions of the cervical spine were also generated. RADIATION DOSE REDUCTION: This exam was performed according to the departmental dose-optimization program which includes automated exposure control, adjustment of the mA and/or kV according to patient size and/or use of iterative reconstruction technique. COMPARISON:  None Available. FINDINGS: CT HEAD FINDINGS Brain: Age related cerebral atrophy, ventriculomegaly and periventricular white matter disease. No extra-axial fluid collections are identified. No CT findings for acute hemispheric infarction or intracranial hemorrhage. No mass lesions. The brainstem and cerebellum are normal. Vascular: Scattered vascular calcifications but no aneurysm or hyperdense vessels. Skull: No skull fracture or bone lesions. Sinuses/Orbits: The paranasal sinuses and mastoid air cells are clear. The globes are intact. Other: Evidence of prior/remote nasal bone fractures. No scalp lesions or scalp hematoma. CT CERVICAL SPINE FINDINGS Alignment: Normal overall alignment. Mild multilevel  degenerative subluxations most notably at C7. Skull base and vertebrae: No acute fracture. No primary bone lesion or focal pathologic process. Soft tissues and spinal canal: No prevertebral fluid or swelling. No visible canal hematoma. Disc levels: The spinal canal is fairly generous. No significant canal stenosis. Mild multilevel foraminal narrowing due to uncinate spurring and facet disease. Upper chest: The lung apices are grossly clear. Moderate distension of the cervical esophagus. Other: Atherosclerotic calcifications involving the carotid arteries. No mass, adenopathy or hematoma. IMPRESSION: 1. Age related cerebral atrophy, ventriculomegaly and periventricular white matter disease. 2. No acute intracranial findings or skull fracture. 3. Normal overall alignment of the cervical vertebral bodies and no acute fracture. 4. Multilevel degenerative cervical spondylosis with mild multilevel foraminal narrowing due to uncinate spurring and facet disease. Electronically Signed   By: MYRTIS Stammer M.D.   On: 07/23/2024 14:54    Microbiology: Results for orders placed or performed during the hospital encounter of 07/23/24  Respiratory (~20 pathogens) panel by PCR     Status: None   Collection Time: 07/25/24 12:30 PM   Specimen: Nasopharyngeal Swab; Respiratory  Result Value Ref Range Status   Adenovirus NOT DETECTED NOT DETECTED Final   Coronavirus 229E  NOT DETECTED NOT DETECTED Final    Comment: (NOTE) The Coronavirus on the Respiratory Panel, DOES NOT test for the novel  Coronavirus (2019 nCoV)    Coronavirus HKU1 NOT DETECTED NOT DETECTED Final   Coronavirus NL63 NOT DETECTED NOT DETECTED Final   Coronavirus OC43 NOT DETECTED NOT DETECTED Final   Metapneumovirus NOT DETECTED NOT DETECTED Final   Rhinovirus / Enterovirus NOT DETECTED NOT DETECTED Final   Influenza A NOT DETECTED NOT DETECTED Final   Influenza B NOT DETECTED NOT DETECTED Final   Parainfluenza Virus 1 NOT DETECTED NOT DETECTED  Final   Parainfluenza Virus 2 NOT DETECTED NOT DETECTED Final   Parainfluenza Virus 3 NOT DETECTED NOT DETECTED Final   Parainfluenza Virus 4 NOT DETECTED NOT DETECTED Final   Respiratory Syncytial Virus NOT DETECTED NOT DETECTED Final   Bordetella pertussis NOT DETECTED NOT DETECTED Final   Bordetella Parapertussis NOT DETECTED NOT DETECTED Final   Chlamydophila pneumoniae NOT DETECTED NOT DETECTED Final   Mycoplasma pneumoniae NOT DETECTED NOT DETECTED Final    Comment: Performed at [facility], [address] [place], [place] [address]  Culture, blood (Routine X 2) w Reflex to ID Panel     Status: None   Collection Time: 07/25/24  1:37 PM   Specimen: BLOOD  Result Value Ref Range Status   Specimen Description BLOOD BLOOD RIGHT ARM  Final   Special Requests BOTTLES DRAWN AEROBIC AND ANAEROBIC BCAV  Final   Culture   Final    NO GROWTH 5 DAYS Performed at [facility], [address], [place], [place] [address]    Report Status 07/30/2024 FINAL  Final  Culture, blood (Routine X 2) w Reflex to ID Panel     Status: None   Collection Time: 07/25/24  1:39 PM   Specimen: BLOOD  Result Value Ref Range Status   Specimen Description BLOOD RIGHT ANTECUBITAL  Final   Special Requests BOTTLES DRAWN AEROBIC AND ANAEROBIC BCAV  Final   Culture   Final    NO GROWTH 5 DAYS Performed at [facility], [address] [place], [place] [address]    Report Status 07/30/2024 FINAL  Final  Resp panel by RT-PCR (RSV, Flu A&B, Covid) Anterior Nasal Swab     Status: None   Collection Time: 07/26/24  9:41 AM   Specimen: Anterior Nasal Swab  Result Value Ref Range Status   SARS Coronavirus 2 by RT PCR NEGATIVE NEGATIVE Final    Comment: (NOTE) SARS-CoV-2 target nucleic acids are NOT DETECTED.  The SARS-CoV-2 RNA is generally detectable in upper respiratory specimens during the acute phase of infection. The lowest concentration of SARS-CoV-2 viral copies this assay can  detect is 138 copies/mL. A negative result does not preclude SARS-Cov-2 infection and should not be used as the sole basis for treatment or other patient management decisions. A negative result may occur with  improper specimen collection/handling, submission of specimen other than nasopharyngeal swab, presence of viral mutation(s) within the areas targeted by this assay, and inadequate number of viral copies(<138 copies/mL). A negative result must be combined with clinical observations, patient history, and epidemiological information. The expected result is Negative.  Fact Sheet for Patients:  bloggercourse.com  Fact Sheet for Healthcare Providers:  seriousbroker.it  This test is no t yet approved or cleared by the [place]  FDA and  has been authorized for detection and/or diagnosis of SARS-CoV-2 by FDA under an Emergency Use Authorization (EUA). This EUA will remain  in effect (meaning this test can be used) for the duration of the COVID-19 declaration under Section 564(b)(1) of the Act, 21 U.S.C.section 360bbb-3(b)(1), unless the authorization is terminated  or revoked sooner.       Influenza A by PCR NEGATIVE NEGATIVE Final   Influenza B by PCR NEGATIVE NEGATIVE Final    Comment: (NOTE) The Xpert Xpress SARS-CoV-2/FLU/RSV plus assay is intended as an aid in the diagnosis of influenza from Nasopharyngeal swab specimens and should not be used as a sole basis for treatment. Nasal washings and aspirates are unacceptable for Xpert Xpress SARS-CoV-2/FLU/RSV testing.  Fact Sheet for Patients: bloggercourse.com  Fact Sheet for Healthcare Providers: seriousbroker.it  This test is not yet approved or cleared by the [place]  FDA and has been authorized for detection and/or diagnosis of SARS-CoV-2 by FDA under an Emergency Use Authorization (EUA). This EUA will remain in  effect (meaning this test can be used) for the duration of the COVID-19 declaration under Section 564(b)(1) of the Act, 21 U.S.C. section 360bbb-3(b)(1), unless the authorization is terminated or revoked.     Resp Syncytial Virus by PCR NEGATIVE NEGATIVE Final    Comment: (NOTE) Fact Sheet for Patients: bloggercourse.com  Fact Sheet for Healthcare Providers: seriousbroker.it  This test is not yet approved or cleared by the [place]  FDA and has been authorized for detection and/or diagnosis of SARS-CoV-2 by FDA under an Emergency Use Authorization (EUA). This EUA will remain in effect (meaning this test can be used) for the duration of the COVID-19 declaration under Section 564(b)(1) of the Act, 21 U.S.C. section 360bbb-3(b)(1), unless the authorization is terminated or revoked.  Performed at [facility] [facility], [address] [place], [place] [address]   Culture, blood (Routine X 2) w Reflex to ID Panel     Status: None (Preliminary result)   Collection Time: 07/26/24 10:59 AM   Specimen: BLOOD  Result Value Ref Range Status   Specimen Description BLOOD BLOOD RIGHT HAND  Final   Special Requests   Final    BOTTLES DRAWN AEROBIC AND ANAEROBIC Blood Culture results may not be optimal due to an inadequate volume of blood received in culture bottles   Culture   Final    NO GROWTH 4 DAYS Performed at [facility], [address] [place], [place] [address]    Report Status PENDING  Incomplete  Culture, blood (Routine X 2) w Reflex to ID Panel     Status: None (Preliminary result)   Collection Time: 07/26/24 11:00 AM   Specimen: BLOOD  Result Value Ref Range Status   Specimen Description BLOOD BLOOD LEFT HAND  Final   Special Requests   Final    BOTTLES DRAWN AEROBIC AND ANAEROBIC Blood Culture adequate volume   Culture   Final    NO GROWTH 4 DAYS Performed at [facility], [address]  [place], [place] [address]    Report Status PENDING  Incomplete  MRSA Next Gen by PCR, Nasal     Status: None   Collection Time: 07/28/24  9:30 AM   Specimen: Nasal Mucosa; Nasal Swab  Result Value Ref Range Status   MRSA by PCR Next Gen NOT DETECTED NOT DETECTED Final    Comment: (NOTE) The GeneXpert MRSA Assay (FDA approved for NASAL specimens only), is one component of a comprehensive MRSA colonization surveillance program. It is not intended to diagnose MRSA infection nor to guide or monitor treatment for MRSA infections. Test performance is not FDA approved  in patients less than 24 years old. Performed at [facility], [address] [place], [place] [address]     Labs: CBC: Recent Labs  Lab 07/24/24 [phone] 07/25/24 0827 07/26/24 0911 07/28/24 1031 07/29/24 0627  WBC 7.6 10.1 11.0* 4.1 3.6*  NEUTROABS  --   --  9.7* 2.5  --   HGB 10.3* 10.6* 8.9* 9.2* 9.0*  HCT 30.9* 32.0* 26.4* 27.8* 27.5*  MCV 116.2* 116.8* 115.3* 116.3* 117.0*  PLT 142* 147* 116* 127* 134*   Basic Metabolic Panel: Recent Labs  Lab 07/24/24 0506 07/25/24 0827 07/26/24 0911 07/28/24 1031 07/29/24 0627 07/30/24 0535  NA 136 138 135 134* 139  --   K 4.4 4.5 4.1 3.7 3.9  --   CL 107 103 105 106 106  --   CO2 22 25 18* 20* 23  --   GLUCOSE 88 98 97 107* 86  --   BUN 35* 27* 28* 18 17  --   CREATININE 1.51* 1.28* 1.19 1.26* 1.11 1.08  CALCIUM 8.3* 8.3* 7.7* 7.7* 8.0*  --    Liver Function Tests: No results for input(s): AST, ALT, ALKPHOS, BILITOT, PROT, ALBUMIN in the last 168 hours. CBG: No results for input(s): GLUCAP in the last 168 hours.  Discharge time spent: greater than 30 minutes.  This record has been created using Conservation officer, historic buildings. Errors have been sought and corrected,but may not always be located. Such creation errors do not reflect on the standard of care.   Signed: Amaryllis Dare, MD [facility] 07/30/2024

## 2024-07-30 NOTE — Progress Notes (Signed)
 Occupational Therapy Treatment Patient Details Name: Gary Lane MRN: [id number] DOB: 06-21-1930 Today's Date: 07/30/2024   History of present illness Pt is a 89 y/o M presenting to ED with c/o rectal bleeding and constipation. MD assessment includes AKI, acute urinary retention, and hyperkalemia. PMH significant for paroxysmal afib, prior cholecystectomy and prostate procedure.   OT comments  Pt is standing with NT on arrival. Pleasant and agreeable to OT session. He denies pain. Pt required CGA for SPT to [facility] and STS from [facility] with ability to perform standing peri-care with unilateral support on RW with CGA. Continues to require therapeutic rest breaks between activity d/t fatigue. Ambulated 63ft using RW with CGA prior to returning to recliner with HR up to 115 and declining further activity at this time. Son present and edu on use of [facility] and MWC at needed for energy conservation and maximizing pt's safety/IND. Pt left seated in recliner with all needs in place and will cont to require skilled acute OT services to maximize his safety and IND to return to PLOF.       If plan is discharge home, recommend the following:  Assistance with cooking/housework;Help with stairs or ramp for entrance;Assist for transportation;A little help with walking and/or transfers;A lot of help with bathing/dressing/bathroom   Equipment Recommendations  BSC/3in1;Wheelchair (measurements OT)    Recommendations for Other Services      Precautions / Restrictions Precautions Precautions: Fall Recall of Precautions/Restrictions: Impaired Restrictions Weight Bearing Restrictions Per Provider Order: No       Mobility Bed Mobility               General bed mobility comments: NT standing with NT on entry    Transfers Overall transfer level: Needs assistance Equipment used: Rolling walker (2 wheels) Transfers: Sit to/from Stand, Bed to chair/wheelchair/BSC Sit to Stand: Contact guard assist      Step pivot transfers: Contact guard assist     General transfer comment: cues for hand placement to stand from [facility], fatigues easily, ambulated ~10 ft total before requiring rest break, Min A for controlled descent to recliner     Balance Overall balance assessment: Needs assistance Sitting-balance support: Feet supported Sitting balance-Leahy Scale: Good Sitting balance - Comments: stable on BSC   Standing balance support: During functional activity, Single extremity supported Standing balance-Leahy Scale: Fair Standing balance comment: Heavy BUE support on RW, CGA; able to perform standing peri-care with unilateral support on RW                           ADL either performed or assessed with clinical judgement   ADL Overall ADL's : Needs assistance/impaired                         Toilet Transfer: Contact guard assist;BSC/3in1;Rolling walker (2 wheels)   Toileting- Clothing Manipulation and Hygiene: Contact guard assist;Sit to/from stand Toileting - Clothing Manipulation Details (indicate cue type and reason): able to stand with unilateral support on RW to perform peri-care after BM     Functional mobility during ADLs: Cueing for safety;Cueing for sequencing;Rolling walker (2 wheels);Contact guard assist      Extremity/Trunk Assessment              Vision       Perception     Praxis     Communication Communication Communication: Impaired Factors Affecting Communication: Hearing impaired   Cognition Arousal: Alert Behavior During  Therapy: WFL for tasks assessed/performed Cognition: No apparent impairments                               Following commands: Intact        Cueing   Cueing Techniques: Verbal cues, Visual cues  Exercises Other Exercises Other Exercises: Edu pt and son on ECS, pacing, task modification, use of DME/AE/AD to maximize ease and prevent overexertion.    Shoulder Instructions       General  Comments HR up to 115 with activity    Pertinent Vitals/ Pain       Pain Assessment Pain Assessment: No/denies pain  Home Living                                          Prior Functioning/Environment              Frequency  Min 2X/week        Progress Toward Goals  OT Goals(current goals can now be found in the care plan section)  Progress towards OT goals: Progressing toward goals  Acute Rehab OT Goals Patient Stated Goal: get stronger OT Goal Formulation: With patient Time For Goal Achievement: 08/07/24 Potential to Achieve Goals: Fair  Plan      Co-evaluation                 AM-PAC OT 6 Clicks Daily Activity     Outcome Measure   Help from another person eating meals?: A Little Help from another person taking care of personal grooming?: A Little Help from another person toileting, which includes using toliet, bedpan, or urinal?: A Little Help from another person bathing (including washing, rinsing, drying)?: A Lot Help from another person to put on and taking off regular upper body clothing?: A Little Help from another person to put on and taking off regular lower body clothing?: A Lot 6 Click Score: 16    End of Session Equipment Utilized During Treatment: Rolling walker (2 wheels);Gait belt  OT Visit Diagnosis: Unsteadiness on feet (R26.81);Repeated falls (R29.6);Muscle weakness (generalized) (M62.81)   Activity Tolerance Patient limited by fatigue   Patient Left with call bell/phone within reach;in chair;with chair alarm set   Nurse Communication Mobility status        Time: [phone] OT Time Calculation (min): 32 min  Charges: OT General Charges $OT Visit: 1 Visit OT Treatments $Self Care/Home Management : 23-37 mins  Rahm Minix, OTR/L  07/30/24, 12:34 PM   Sabrea Sankey E Kaedon Fanelli 07/30/2024, 12:32 PM

## 2024-07-31 LAB — CULTURE, BLOOD (ROUTINE X 2)
Culture: NO GROWTH
Culture: NO GROWTH
Special Requests: ADEQUATE

## 2024-08-01 ENCOUNTER — Telehealth: Payer: Self-pay

## 2024-08-01 NOTE — Telephone Encounter (Signed)
 Pts son LM on the triage line wanting to know how to keep pts cath in place and prevent infection.   Advise son the following :  Wash your hands with soap and water before and after touching the catheter, tubing, or drainage bag. Do not pull on your catheter or try to remove it. Keep the drainage bag at or below the level of your bladder, but do not let the drainage bag or catheter tubing touch the floor. Get help right away if you have a fever, chills, or any other signs of infection.   VT scheduled for 11/11.

## 2024-08-07 ENCOUNTER — Ambulatory Visit: Admitting: Physician Assistant

## 2024-08-07 NOTE — Progress Notes (Signed)
 Chief Complaint  Patient presents with  . Hospital Follow Up    HPI  History of Present Illness Gary Lane is a 89 year old male who presents for hospital follow-up after being discharged with urinary retention and infection concerns.  Urinary retention and infection -Initially evaluated emergency room for blood in the stool after history of constipation while on tramadol but then noted to have hyperkalemia, hyponatremia, distended bladder, and acute renal insufficiency - Hospital admission to [facility] on July 23, 2024-Jul 30, 2024 for urinary retention and infection concerns - Persistent urinary retention during hospitalization, requiring Foley catheter placement - Initiation of Flomax for urinary retention - No hydronephrosis on renal ultrasound - Distended bladder noted on abdominal CT - Treated with five days of broad-spectrum antibiotics for chills on July 24, 2024 - Blood cultures during hospitalization negative for bacterial growth - Discharged on July 30, 2024, with urology follow-up recommended with Dr. Twylla on 08/14/24 for BPH and foley management - No current chills or fever, except for a recent temp 100 deg managed with Tylenol  Fever and constitutional symptoms - Recent fever of 100F under the arm, resolved to 61F within four hours after two Tylenol and hydration - No current chills or fever  Gastrointestinal symptoms and bowel function - Hospital admission for blood in the stool and constipation related to narcotic use - Treated with enema during hospitalization, resulting in good bowel movement - No current blood in the stool - No longer taking narcotic pain medications; pain managed with two Tylenol per day  Renal function and electrolyte abnormalities - Acute renal insufficiency during hospitalization with creatinine level of 1.92, improved to 1.51 after treatment - Hyperkalemia with potassium level of 5.6, treated with IV fluids and Lokelma -  Mild hyponatremia with sodium level of 130  Hematologic findings and iron supplementation - Low iron levels noted during hospitalization - Started on Trilla brand iron bisglycinate to minimize gastrointestinal side effects  Dental infection - Upcoming dental appointment for suspected tooth infection  Functional status and home health services - Receiving home health services - Occupational therapy scheduled to visit - Physical therapy evaluation completed last week - Encouraged to maintain hydration to support blood pressure management  ROS Review of systems is unremarkable for any active cardiac, respiratory, GI, GU, hematologic, neurologic, dermatologic, HEENT, or psychiatric symptoms except as noted above.  No fevers, chills, or constitutional symptoms.   Current Outpatient Medications  Medication Sig Dispense Refill  . acetaminophen (TYLENOL) 500 MG tablet Take 1,000 mg by mouth every 8 (eight) hours as needed    . aspirin 81 MG EC tablet Take 1 tablet (81 mg total) by mouth once daily    . cyanocobalamin (VITAMIN B12) 1000 MCG tablet Take 1 tablet (1,000 mcg total) by mouth once daily    . Herbal Supplement Herbal Name: Vitamin D, Garlic, Zinc,  Milk Thistle, Glucosamine    . lactulose (ENULOSE) 10 gram/15 mL oral solution Take 30 g by mouth as needed    . magnesium gluconate (MAG-G) 27 mg (500 mg) tablet Take 500 mg by mouth once daily    . midodrine (PROAMATINE) 10 MG tablet Take 10 mg by mouth once daily    . sucralfate (CARAFATE) 100 mg/mL suspension Take 1 g by mouth as needed    . tamsulosin (FLOMAX) 0.4 mg capsule Take 0.4 mg by mouth daily after dinner     No current facility-administered medications for this visit.    Allergies as of 08/07/2024  . (  No Known Allergies)    Patient Active Problem List  Diagnosis  . Paroxysmal atrial fibrillation (dx 10/2019) previously on Eliquis  - followed by Dr. Darron  . Medicare annual wellness visit, initial 03/14/20  . BPH  associated with nocturia  . Primary osteoarthritis involving multiple joints  . Medicare annual wellness visit, subsequent 08/26/23  . Pancytopenia (WBC 4, Hgb 12.5, Plt 141 - 08/19/23)  . Iron deficiency anemia secondary to inadequate dietary iron intake (Hgb 10.3, iron 33 - 07/24/24)    Past Medical History:  Diagnosis Date  . Anemia of chronic disease (Hgb 12.5, MCV 107 - 02/29/20) 03/14/2020  . Arrhythmia October 16, 2019  . Arthritis   . CKD (chronic kidney disease) stage 3, (Cr 1.69, GFR 36 - 10/16/19) 11/16/2019    Past Surgical History:  Procedure Laterality Date  . HERNIA REPAIR  1947  . APPENDECTOMY  1962  . CHOLECYSTECTOMY    . PROSTATE SURGERY      Vitals:   08/07/24 1318  BP: 100/62  Pulse: 79  SpO2: 98%  Weight: 74.8 kg (164 lb 12.8 oz)  Height: 182.9 cm (6')  PainSc: 0-No pain   Body mass index is 22.35 kg/m.  Exam  General. Well appearing elderly male in his baseline state arrived via wheelchair with Foley in place and accompanied by family; NAD; VS reviewed     Eyes. Sclera and conjunctiva clear; Vision grossly intact; extraocular movements intact Neck. Supple.   Lungs. Respirations unlabored; clear to auscultation bilaterally Cardiovascular. Heart regular rate and rhythm without murmurs, gallops, or rubs Skin. Normal color and turgor Neurologic. Alert and oriented x3; no new focal deficits  Assessment and Plan  Assessment & Plan Hospital Follow-up for urinary retention with indwelling Foley catheter due to benign prostatic hyperplasia Urinary retention secondary to benign prostatic hyperplasia, managed with indwelling Foley catheter. Flomax prescribed to aid in urination. Blood pressure is low, requiring careful monitoring due to potential hypotensive effects of Flomax. Blood in urine likely due to Foley catheter movement causing trauma. - Continue Flomax for urinary retention. - Ensure adequate hydration to support blood pressure. - Follow up with  urology on November 11th for further management of Foley catheter and urinary retention.  Iron deficiency anemia Low iron levels. Current iron supplement may cause constipation. - Switch to Winnie brand iron bisglycinate for better gastrointestinal tolerance.  Hypotension Low blood pressure, potentially exacerbated by Flomax. Hydration is crucial to maintain blood pressure. - Ensure adequate hydration to support blood pressure.  History of acute renal insufficiency, resolved Acute renal insufficiency resolved with creatinine improved to 1.08 and GFR greater than 60 at discharge.  History of facial swelling and pain due to dental infection Facial swelling and pain due to dental infection. Blood cultures did not grow any bacteria. Follow-up with dentist scheduled to address potential ongoing infection. - Follow up with dentist to evaluate and manage potential dental infection.  Medications and allergies reviewed and reconciled.  Hospital notes, labs, and studies reviewed.  Transitional phone call complete within 48 hours of discharge.  Follow-up as scheduled or sooner as needed.  Urology appointment pending  ALDA CARPEN, MD

## 2024-08-14 ENCOUNTER — Ambulatory Visit: Admitting: Physician Assistant

## 2024-08-14 ENCOUNTER — Ambulatory Visit (INDEPENDENT_AMBULATORY_CARE_PROVIDER_SITE_OTHER): Admitting: Physician Assistant

## 2024-08-14 VITALS — BP 118/77 | HR 100

## 2024-08-14 DIAGNOSIS — R351 Nocturia: Secondary | ICD-10-CM | POA: Diagnosis not present

## 2024-08-14 DIAGNOSIS — R339 Retention of urine, unspecified: Secondary | ICD-10-CM | POA: Diagnosis not present

## 2024-08-14 LAB — BLADDER SCAN AMB NON-IMAGING: Scan Result: 70 mL

## 2024-08-14 NOTE — Progress Notes (Signed)
 Catheter Removal  Patient is present today for a catheter removal.  9ml of water was drained from the balloon. A 14FR foley cath was removed from the bladder, no complications were noted. Patient tolerated well.  Performed by: Beauford Browner, CCMA   Follow up/ Additional notes: Afternoon appointment for PVR

## 2024-08-14 NOTE — Progress Notes (Signed)
 08/14/2024 4:31 PM   Gary Lane 1930-08-09 [id number]  CC: Chief Complaint  Patient presents with   Follow-up   HPI: Gary Lane is a 89 y.o. male with PMH unknown prostate procedure and a recent history of urinary retention in the setting of constipation requiring Foley catheter placement on 07/30/2024 who presents today as a new patient for voiding trial.  He is accompanied today by his son, Gary Lane, who contributes to HPI.  Foley removed in the morning; see separate note.  He returned in the afternoon. He reports he has voided without difficulty. PVR 70mL.  Gary Lane reports that Gary Lane recent retention episode started with a dental infection, for which she was prescribed pain meds, which caused the constipation that caused the retention.  Gary Lane has never had an episode of urinary retention before, though he does report having undergone a prostate trim about 35 years ago.  He is newly on Flomax since leaving the hospital and is tolerating it without orthostasis.  He has not noticed a significant change in his voiding symptoms on the medication.  He describes chronic nocturia x 2-3, but they deny snoring or BLE edema.  They have been prescribed amoxicillin for his dental infection, but not yet started it.  PMH: Past Medical History:  Diagnosis Date   PAF (paroxysmal atrial fibrillation) (HCC)    Paroxysmal SVT (supraventricular tachycardia)    Surgical History: Past Surgical History:  Procedure Laterality Date   APPENDECTOMY     BACK SURGERY     lumbar   CHOLECYSTECTOMY     ELBOW SURGERY Right    HERNIA REPAIR      Home Medications:  Allergies as of 08/14/2024   No Known Allergies      Medication List        Accurate as of August 14, 2024  4:31 PM. If you have any questions, ask your nurse or doctor.          STOP taking these medications    cyanocobalamin 1000 MCG tablet Stopped by: Avalina Benko   lactulose 10 GM/15ML  solution Commonly known as: CHRONULAC Stopped by: Eliyana Pagliaro   Rivaroxaban  15 MG Tabs tablet Commonly known as: Xarelto  Stopped by: Tamario Heal   sucralfate 1 GM/10ML suspension Commonly known as: CARAFATE Stopped by: Jinx Gilden   traMADol 50 MG tablet Commonly known as: ULTRAM Stopped by: Arlow Spiers       TAKE these medications    acetaminophen 500 MG tablet Commonly known as: TYLENOL Take 500 mg by mouth 2 (two) times daily as needed.   aspirin EC 81 MG tablet Take 81 mg by mouth daily.   feeding supplement Liqd Take 237 mLs by mouth 2 (two) times daily between meals.   ferrous sulfate 325 (65 FE) MG tablet Take 1 tablet (325 mg total) by mouth daily with breakfast.   GARLIC PO Take by mouth daily at 8 pm.   GLUCOSAMINE PO Take by mouth daily at 2 am.   magnesium gluconate 500 (27 Mg) MG Tabs tablet Commonly known as: MAGONATE Take 500 mg by mouth daily.   midodrine 10 MG tablet Commonly known as: PROAMATINE Take 1 tablet (10 mg total) by mouth 3 (three) times daily with meals.   milk thistle 175 MG tablet Take 175 mg by mouth daily.   tamsulosin 0.4 MG Caps capsule Commonly known as: FLOMAX Take 1 capsule (0.4 mg total) by mouth daily after supper.   VITAMIN D PO Take by  mouth daily at 8 pm.        Allergies:  No Known Allergies  Family History: Family History  Problem Relation Age of Onset   Stroke Mother     Social History:   reports that he has never smoked. He has never used smokeless tobacco. He reports that he does not currently use alcohol. He reports that he does not currently use drugs.  Physical Exam: BP 118/77 (BP Location: Left Arm, Patient Position: Sitting, Cuff Size: Normal)   Pulse 100   SpO2 96%   Constitutional:  Alert and oriented, no acute distress, nontoxic appearing HEENT: [place], AT Cardiovascular: No clubbing, cyanosis, or edema Respiratory: Normal respiratory effort, no  increased work of breathing Skin: No rashes, bruises or suspicious lesions Neurologic: Grossly intact, no focal deficits, moving all 4 extremities Psychiatric: Normal mood and affect  Laboratory Data: Results for orders placed or performed in visit on 08/14/24  BLADDER SCAN AMB NON-IMAGING   Collection Time: 08/14/24  5:25 PM  Result Value Ref Range   Scan Result 70 ml   Assessment & Plan:   1. Urinary retention (Primary) Voiding trial passed.  Likely multifactorial in the setting of BPH and constipation.  Will have him continue Flomax and recheck a PVR in 4 months to ensure stability.  If he is emptying appropriately in another month, may consider stopping Flomax since he has not had any bothersome obstructive symptoms leading up to his recent episode. - BLADDER SCAN AMB NON-IMAGING   2. Nocturia Chronic nocturia x 2-3.  May consider OAB agents based on clinical course if he continues to empty appropriately.  He is not a candidate for desmopressin due to age.  Return in about 4 weeks (around 09/11/2024) for Repeat PVR.  Lucie Hones, PA-C  [facility] [address] [place], [place] [address] [phone]

## 2024-09-11 ENCOUNTER — Ambulatory Visit: Admitting: Physician Assistant

## 2024-09-11 ENCOUNTER — Encounter: Payer: Self-pay | Admitting: Physician Assistant

## 2024-09-11 VITALS — BP 128/78 | HR 106 | Ht 72.0 in | Wt 167.2 lb

## 2024-09-11 DIAGNOSIS — R339 Retention of urine, unspecified: Secondary | ICD-10-CM

## 2024-09-11 DIAGNOSIS — R351 Nocturia: Secondary | ICD-10-CM

## 2024-09-11 LAB — BLADDER SCAN AMB NON-IMAGING

## 2024-09-11 NOTE — Patient Instructions (Signed)
 Call to check up on urinary symptoms on Friday Per Sam.

## 2024-09-11 NOTE — Progress Notes (Signed)
 09/11/2024 10:57 AM   Vinie FORBES Louder [birth year] [id number]  CC: Chief Complaint  Patient presents with   Urinary Retention   HPI: Gary Lane is a 89 y.o. male with PMH remote unknown prostate procedure and a recent history of urinary retention in the setting of pain medication/constipation who passed a voiding trial with me last month who presents today for repeat PVR.  He is accompanied today by his son, [name], who contributes to HPI.  Today he reports no bothersome voiding symptoms.  He remains on Flomax , a.m. dosing, and tolerates that well.  He specifically denies dysuria, gross hematuria, and difficulty voiding.  He continues to experience nocturia x 2-3, but is typically in bed between 8 PM and 7 or 8 AM.  This is not particularly bothersome for him.  PVR 0 mL.  PMH: Past Medical History:  Diagnosis Date   PAF (paroxysmal atrial fibrillation) (HCC)    Paroxysmal SVT (supraventricular tachycardia)     Surgical History: Past Surgical History:  Procedure Laterality Date   APPENDECTOMY     BACK SURGERY     lumbar   CHOLECYSTECTOMY     ELBOW SURGERY Right    HERNIA REPAIR      Home Medications:  Allergies as of 09/11/2024   No Known Allergies      Medication List        Accurate as of September 11, 2024 10:57 AM. If you have any questions, ask your nurse or doctor.          STOP taking these medications    ferrous sulfate  325 (65 FE) MG tablet Stopped by: Nina Hoar   tamsulosin  0.4 MG Caps capsule Commonly known as: FLOMAX  Stopped by: Lucie Hones       TAKE these medications    acetaminophen  500 MG tablet Commonly known as: TYLENOL  Take 500 mg by mouth 2 (two) times daily as needed.   amoxicillin  500 MG tablet Commonly known as: AMOXIL  Take 500 mg by mouth.   aspirin  EC 81 MG tablet Take 81 mg by mouth daily.   Easy Iron 28 MG Caps Generic drug: Ferrous Bisglycinate Chelate Take by mouth.   feeding  supplement Liqd Take 237 mLs by mouth 2 (two) times daily between meals.   GARLIC PO Take by mouth daily at 8 pm.   GLUCOSAMINE PO Take by mouth daily at 2 am.   magnesium  gluconate 500 (27 Mg) MG Tabs tablet Commonly known as: MAGONATE Take 500 mg by mouth daily.   midodrine  10 MG tablet Commonly known as: PROAMATINE  Take 1 tablet (10 mg total) by mouth 3 (three) times daily with meals.   milk thistle 175 MG tablet Take 175 mg by mouth daily.   VITAMIN D PO Take by mouth daily at 8 pm.        Allergies:  No Known Allergies  Family History: Family History  Problem Relation Age of Onset   Stroke Mother     Social History:   reports that he has never smoked. He has never used smokeless tobacco. He reports that he does not currently use alcohol. He reports that he does not currently use drugs.  Physical Exam: BP 128/78   Pulse (!) 106   Ht 6' (1.829 m)   Wt 167 lb 3.2 oz (75.8 kg)   BMI 22.68 kg/m   Constitutional:  Alert and oriented, no acute distress, nontoxic appearing HEENT: Chunky, AT Cardiovascular: No clubbing, cyanosis, or edema Respiratory: Normal respiratory effort,  no increased work of breathing Skin: No rashes, bruises or suspicious lesions Neurologic: Grossly intact, no focal deficits, moving all 4 extremities Psychiatric: Normal mood and affect  Laboratory Data: Results for orders placed or performed in visit on 09/11/24  Bladder Scan (Post Void Residual) in office   Collection Time: 09/11/24 10:26 AM  Result Value Ref Range   Scan Result 0ml    Assessment & Plan:   1. Urinary retention (Primary) He continues to empty appropriately.  Will have him stop Flomax  and call him on Friday to recheck his urinary symptoms off it.  If he describes obstructive voiding symptoms on Flomax , we can easily resume it.  May also consider PM dosing as needed. - Bladder Scan (Post Void Residual) in office  2. Nocturia We discussed that it is not unreasonable to  avoid 2 times in an 11 to 12-hour span.  Will see how he does off Flomax ; if his nocturia worsens we can resume it and consider PM dosing.  Alternatively, may consider low-dose OAB meds.  I strongly hesitate to try antimuscarinics given his recent episode of retention associated with constipation as well as his age.  Return for Will call Friday to recheck symptoms off Flomax .  Zanobia Griebel, PA-C  [facility]  [address], [address] [place], [place] [address] [phone]

## 2024-10-31 ENCOUNTER — Telehealth: Payer: Self-pay | Admitting: Physician Assistant

## 2024-10-31 NOTE — Telephone Encounter (Signed)
 Spoke with son Gary Lane [facility]) he stated patient is still the same as he was when he was on the Flomax . States there is no difference. Patient has 2-3 trips to restroom during night time and patient still going to bed around 8 pm. Patient is back home and son has a camera set up so he can watch him however, he still notices the same amount of visits to the restroom. Son denies any new symptoms patient is having and stated if he notices anything different he would reach out to the clinic.

## 2024-10-31 NOTE — Telephone Encounter (Signed)
 I attempted to call him in December to recheck his symptoms off Flomax , but was unable to connect with him.  Can you please try calling him and see if he is having any obstructive urinary symptoms or worsening nighttime urination off the Flomax ?

## 2024-10-31 NOTE — Telephone Encounter (Signed)
 Great, will keep him off Flomax .

## 2025-05-31 ENCOUNTER — Encounter (INDEPENDENT_AMBULATORY_CARE_PROVIDER_SITE_OTHER)

## 2025-05-31 ENCOUNTER — Ambulatory Visit (INDEPENDENT_AMBULATORY_CARE_PROVIDER_SITE_OTHER): Admitting: Nurse Practitioner
